# Patient Record
Sex: Male | Born: 1950 | Race: White | Hispanic: No | Marital: Married | Smoking: Former smoker
Health system: Southern US, Community
[De-identification: ages and names within clinical notes are randomized; demographics above are authoritative.]

## PROBLEM LIST (undated history)

## (undated) DIAGNOSIS — G629 Polyneuropathy, unspecified: Secondary | ICD-10-CM

## (undated) DIAGNOSIS — H9319 Tinnitus, unspecified ear: Secondary | ICD-10-CM

## (undated) DIAGNOSIS — R251 Tremor, unspecified: Secondary | ICD-10-CM

## (undated) DIAGNOSIS — K219 Gastro-esophageal reflux disease without esophagitis: Secondary | ICD-10-CM

## (undated) DIAGNOSIS — I10 Essential (primary) hypertension: Secondary | ICD-10-CM

## (undated) HISTORY — DX: Gastro-esophageal reflux disease without esophagitis: K21.9

## (undated) HISTORY — DX: Polyneuropathy, unspecified: G62.9

## (undated) HISTORY — DX: Tremor, unspecified: R25.1

## (undated) HISTORY — PX: ORIF TIBIA & FIBULA FRACTURES: SHX2131

## (undated) HISTORY — DX: Tinnitus, unspecified ear: H93.19

## (undated) HISTORY — DX: Essential (primary) hypertension: I10

---

## 1998-01-14 ENCOUNTER — Emergency Department (HOSPITAL_COMMUNITY): Admission: EM | Admit: 1998-01-14 | Discharge: 1998-01-14 | Payer: Self-pay | Admitting: Emergency Medicine

## 2004-03-29 ENCOUNTER — Inpatient Hospital Stay (HOSPITAL_COMMUNITY): Admission: EM | Admit: 2004-03-29 | Discharge: 2004-04-01 | Payer: Self-pay | Admitting: *Deleted

## 2004-03-29 HISTORY — DX: Rider (driver) (passenger) of other motorcycle injured in unspecified traffic accident, initial encounter: V29.99XA

## 2004-04-14 ENCOUNTER — Ambulatory Visit: Payer: Self-pay | Admitting: Physical Medicine & Rehabilitation

## 2004-04-14 ENCOUNTER — Inpatient Hospital Stay (HOSPITAL_COMMUNITY): Admission: RE | Admit: 2004-04-14 | Discharge: 2004-04-17 | Payer: Self-pay | Admitting: Orthopedic Surgery

## 2004-07-01 ENCOUNTER — Encounter: Admission: RE | Admit: 2004-07-01 | Discharge: 2004-07-01 | Payer: Self-pay | Admitting: Orthopedic Surgery

## 2004-08-12 ENCOUNTER — Encounter: Admission: RE | Admit: 2004-08-12 | Discharge: 2004-08-12 | Payer: Self-pay | Admitting: Orthopedic Surgery

## 2004-08-18 ENCOUNTER — Inpatient Hospital Stay (HOSPITAL_COMMUNITY): Admission: RE | Admit: 2004-08-18 | Discharge: 2004-08-20 | Payer: Self-pay | Admitting: Orthopedic Surgery

## 2004-08-18 ENCOUNTER — Ambulatory Visit: Payer: Self-pay | Admitting: Physical Medicine & Rehabilitation

## 2005-05-04 ENCOUNTER — Emergency Department (HOSPITAL_COMMUNITY): Admission: EM | Admit: 2005-05-04 | Discharge: 2005-05-04 | Payer: Self-pay | Admitting: Emergency Medicine

## 2006-03-14 IMAGING — CT CT RECONSTRUCTION
2 of 4 series · 8 of 14 positions shown, 10 images · non-contrast
Comparison: none

CLINICAL DATA: 53-year-old with left tibial plateau fracture.  
HELICAL CT EXAMINATION OF THE LEFT KNEE:
High resolution thin slice CT imaging was performed of the left knee with multiplanar reconstructed images performed.  3D reformatted images were also obtained.
There is a femoral screw in place for external fixation purposes.  There is a severely comminuted intraarticular fracture both medially and laterally.  The lateral tibial plateau is depressed approximately 9 mm. The tibial spines are in several pieces.  There is a large fracture fragment extending down below the medial tibial plateau and the medial tibial plateau is maintained in height.

[Series 2: lt knee · axial · 0.32mm/px · z∈[-354,-256]mm · 3 of 156 slices shown (1 of 2)]
[im 39/156  bone]
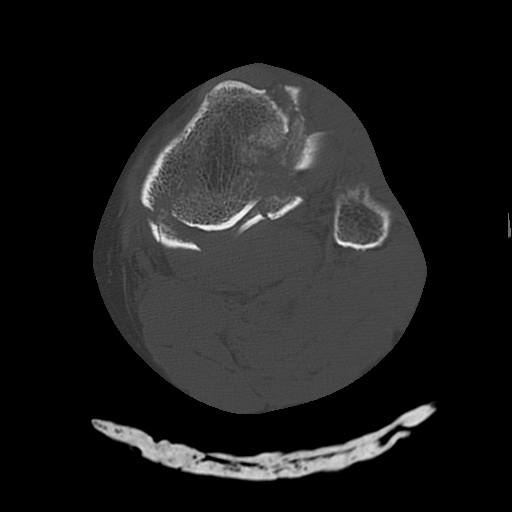
[im 78/156  bone]
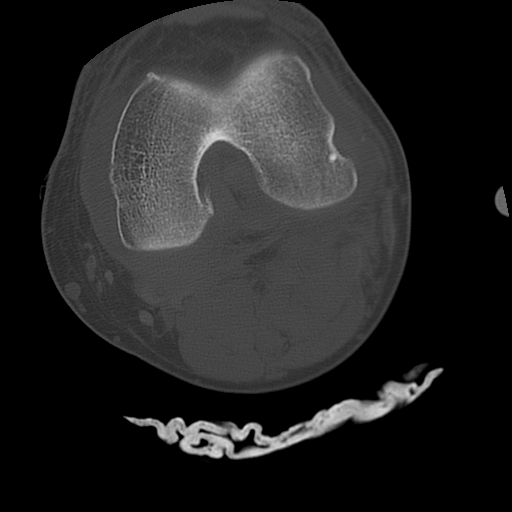
[im 117/156  bone]
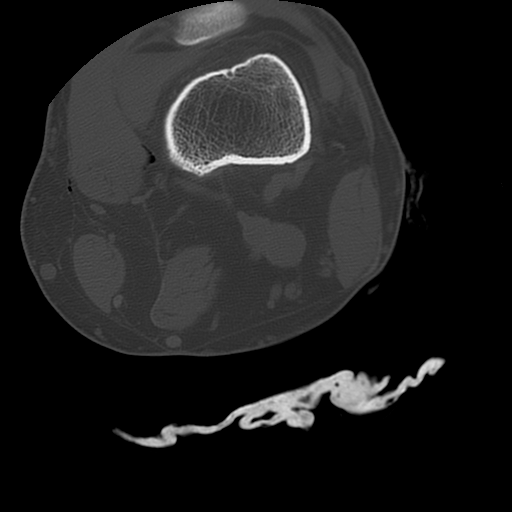

[Series 102: lt knee · axial · 0.32mm/px · z∈[-370,-240]mm · 5 of 313 slices shown, 7 images (2 of 2)]
[im 53/313  soft-tissue]
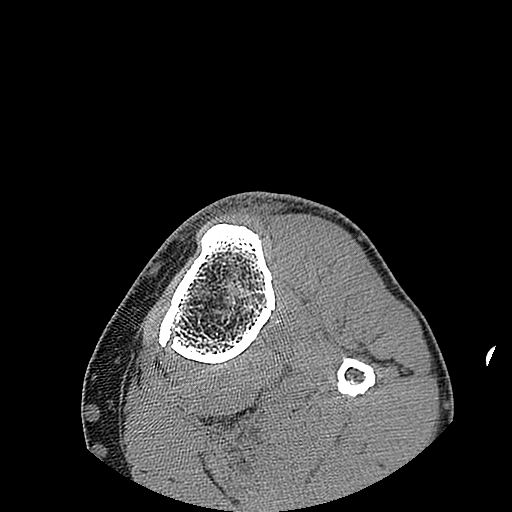
[im 53/313  bone]
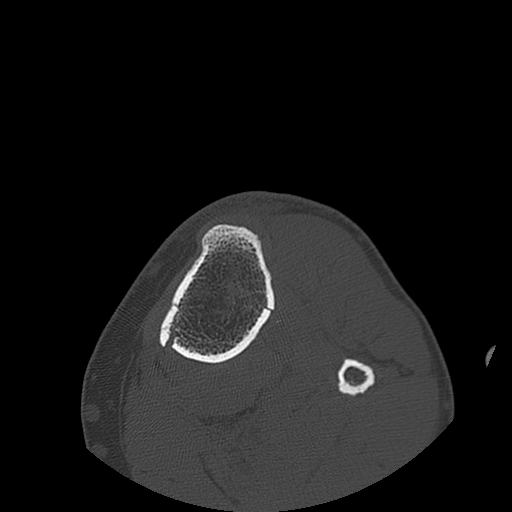
[im 105/313  bone]
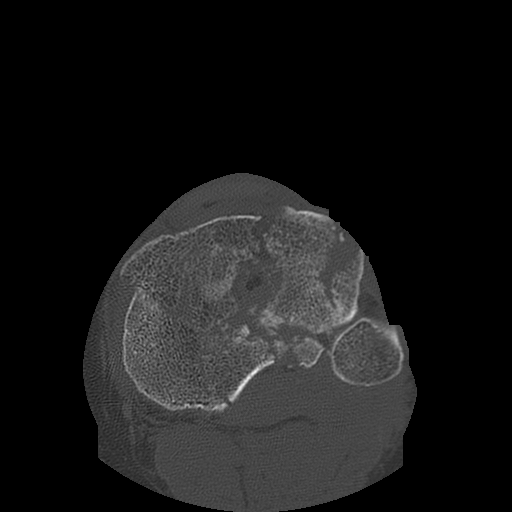
[im 157/313  bone]
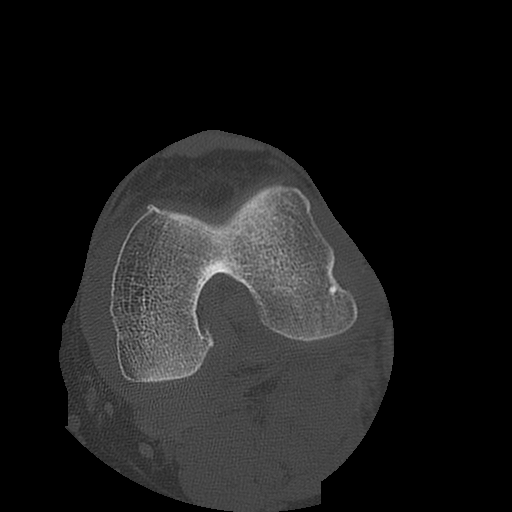
[im 209/313  bone]
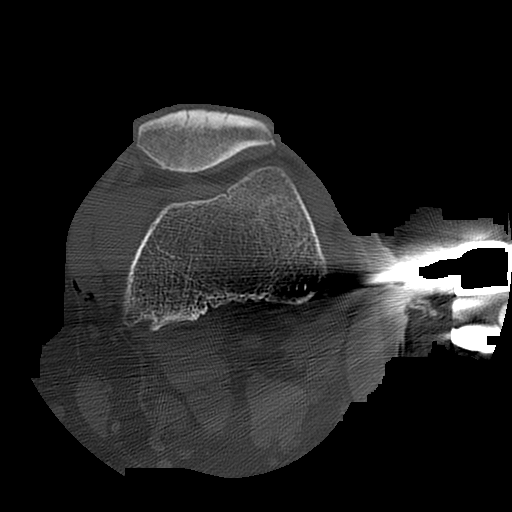
[im 261/313  soft-tissue]
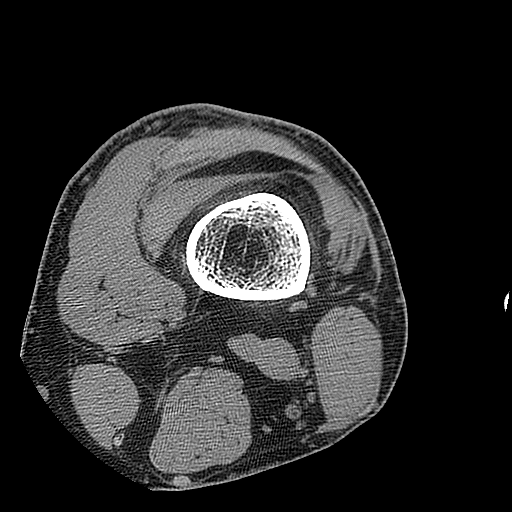
[im 261/313  bone]
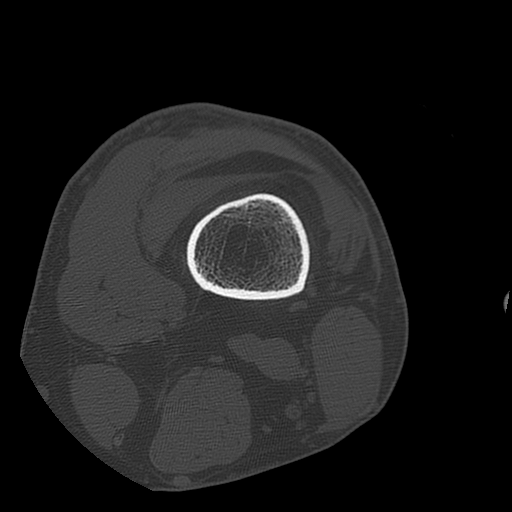

[8 of 14 positions shown; findings below may reference images not displayed]

IMPRESSION: Severely comminute intraarticular tibial plateau fractures with significant depression laterally. 
MULTIPLANAR REFORMATTED IMAGES: 
Coronal and sagittal reformatted images were obtained along with 3D images and again demonstrate the severely depressed and comminuted lateral tibial plateau fracture.  The medial tibial plateau has an oblique fracture and there is slight depression best seen in the sagittal plane with intraarticular component anteriorly and extending far medially.
IMPRESSION: Severely comminuted depressed tibial plateau fractures.
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
## 2006-03-30 IMAGING — RF DG TIBIA/FIBULA 2V*L*
1 series · 3 of 3 positions shown · non-contrast
Comparison: 03/29/04 CT.

CLINICAL DATA: Intraoperative spot fluoroscopic views during tibial plateau fracture, open reduction internal fixation.
LEFT TIBIA/FIBULA:

[Series 1: run · 3 of 3 slices shown]
[im 1/3]
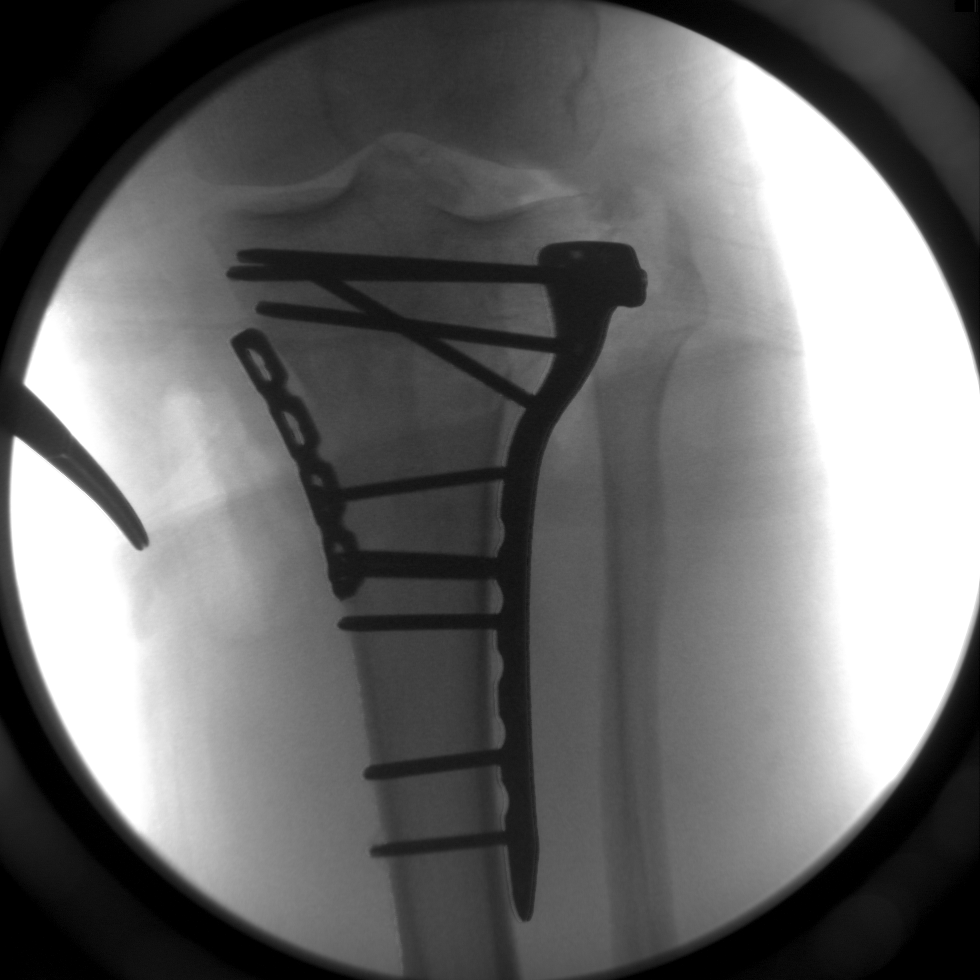
[im 2/3]
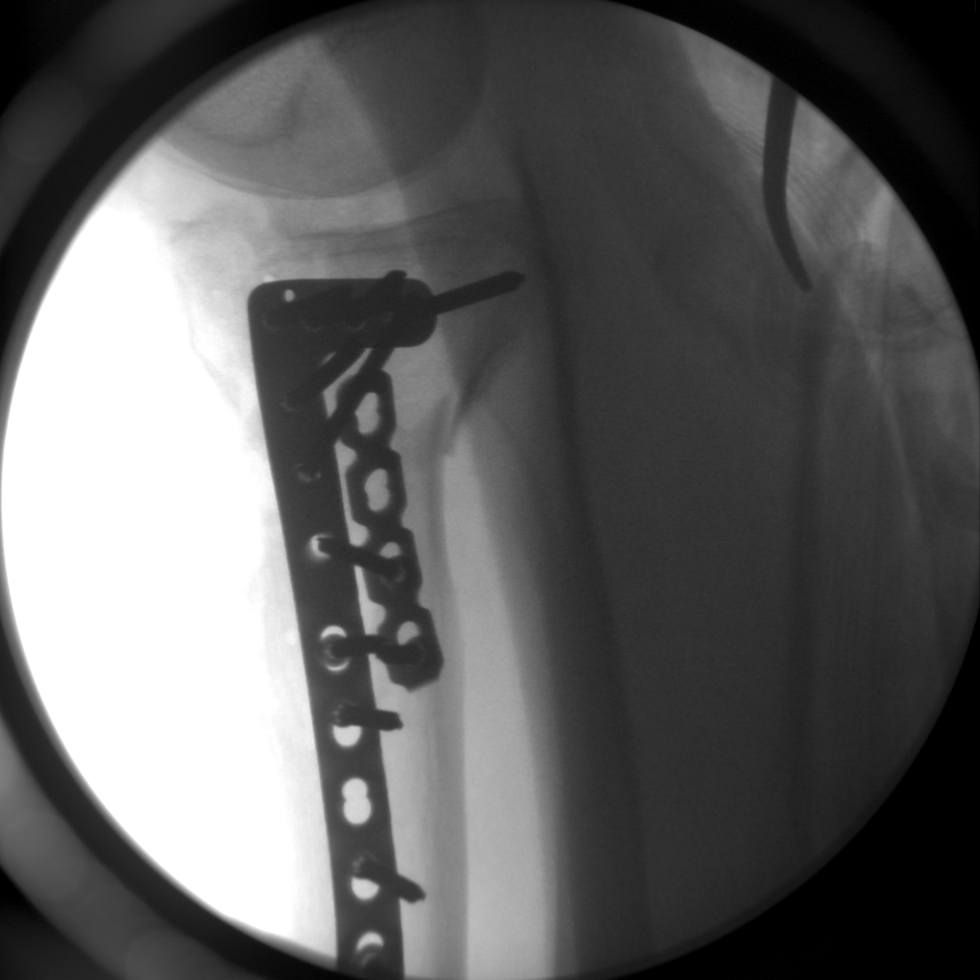
[im 3/3]
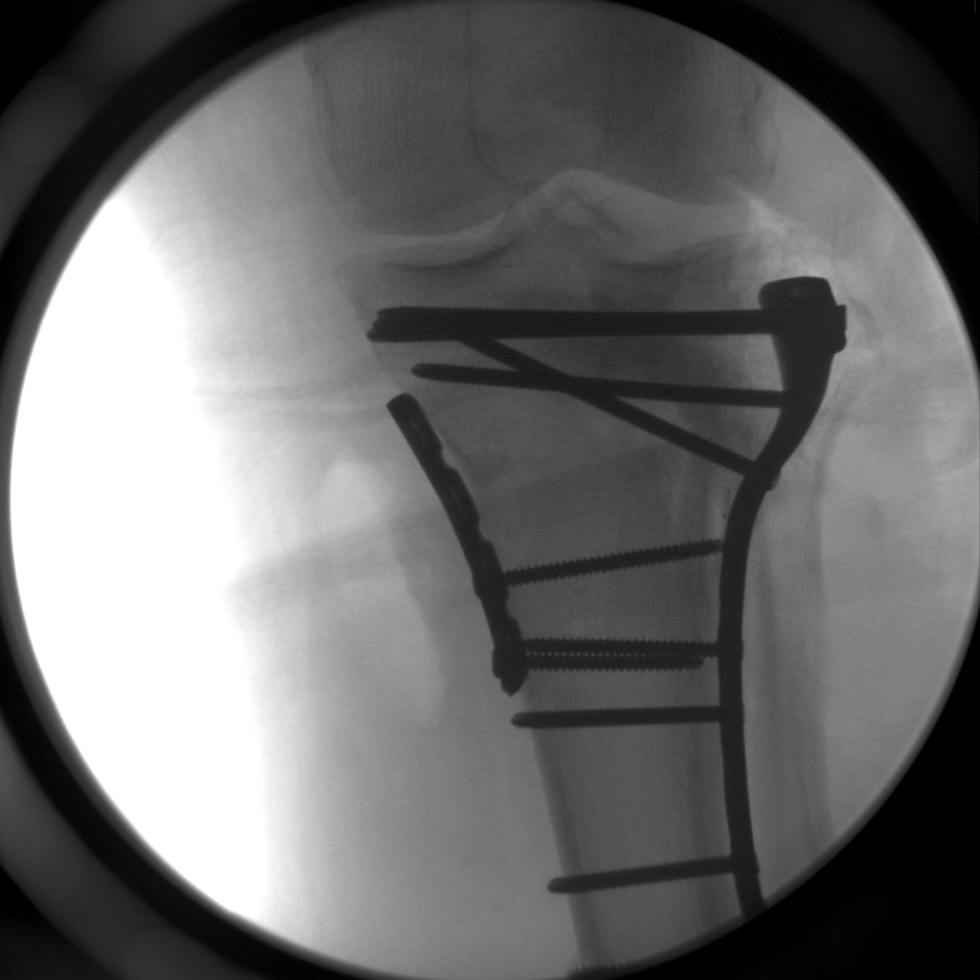

[3 of 3 positions shown; findings below may reference images not displayed]

FINDINGS: Open reduction internal fixation has been performed of the comminuted depressed lateral tibial plateau fracture. There is very minimal residual depression of the lateral tibial plateau. Plate and screw fixation is noted.
IMPRESSION: Improved alignment of a lateral tibial plateau intra-articular comminuted fracture with minimal residual depression.

## 2006-03-30 IMAGING — CR DG KNEE 1-2V PORT*L*
2 series · 2 of 2 positions shown · non-contrast
Comparison: none

CLINICAL DATA: Status post ORIF for plateau fracture.
LEFT KNEE TWO VIEWS - PORTABLE:
Two views using the portable apparatus show the patient status post ORIF for comminuted tibial plateau fracture.  Good position and alignment in two views.

[view not recorded (1 of 2)]
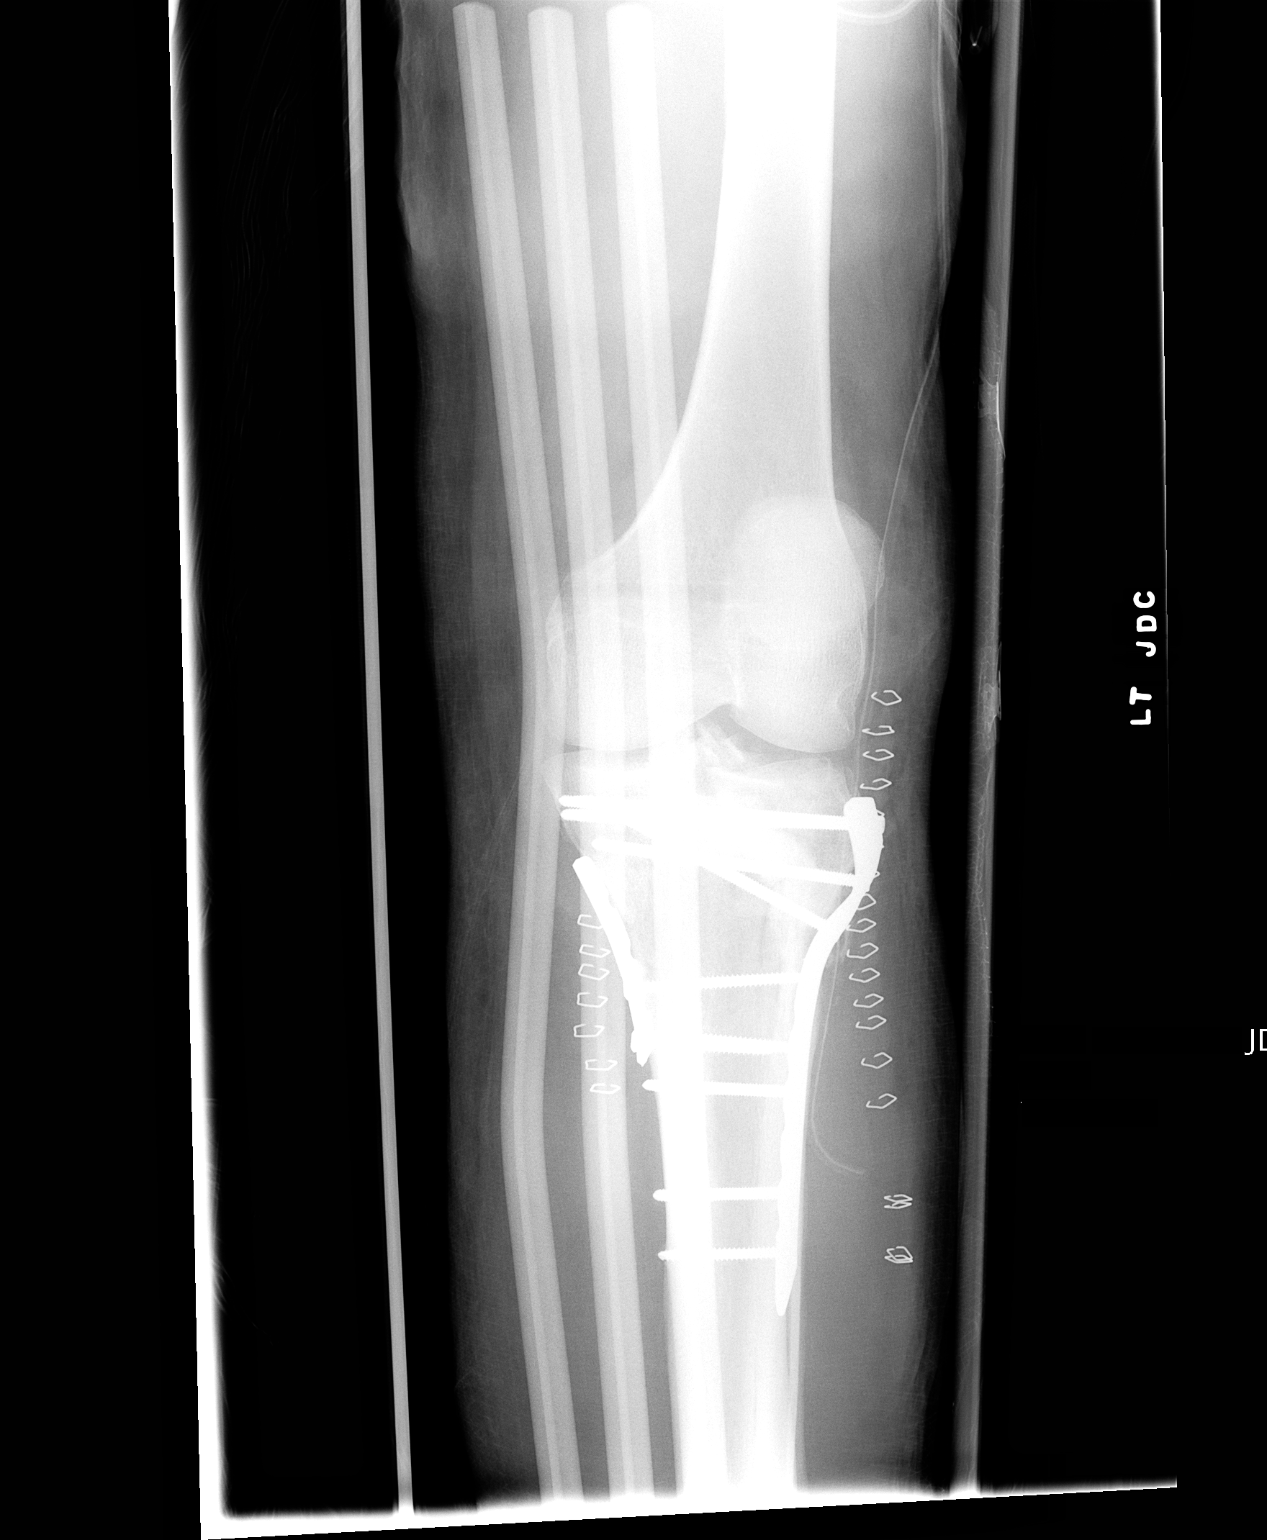

[view not recorded (2 of 2)]
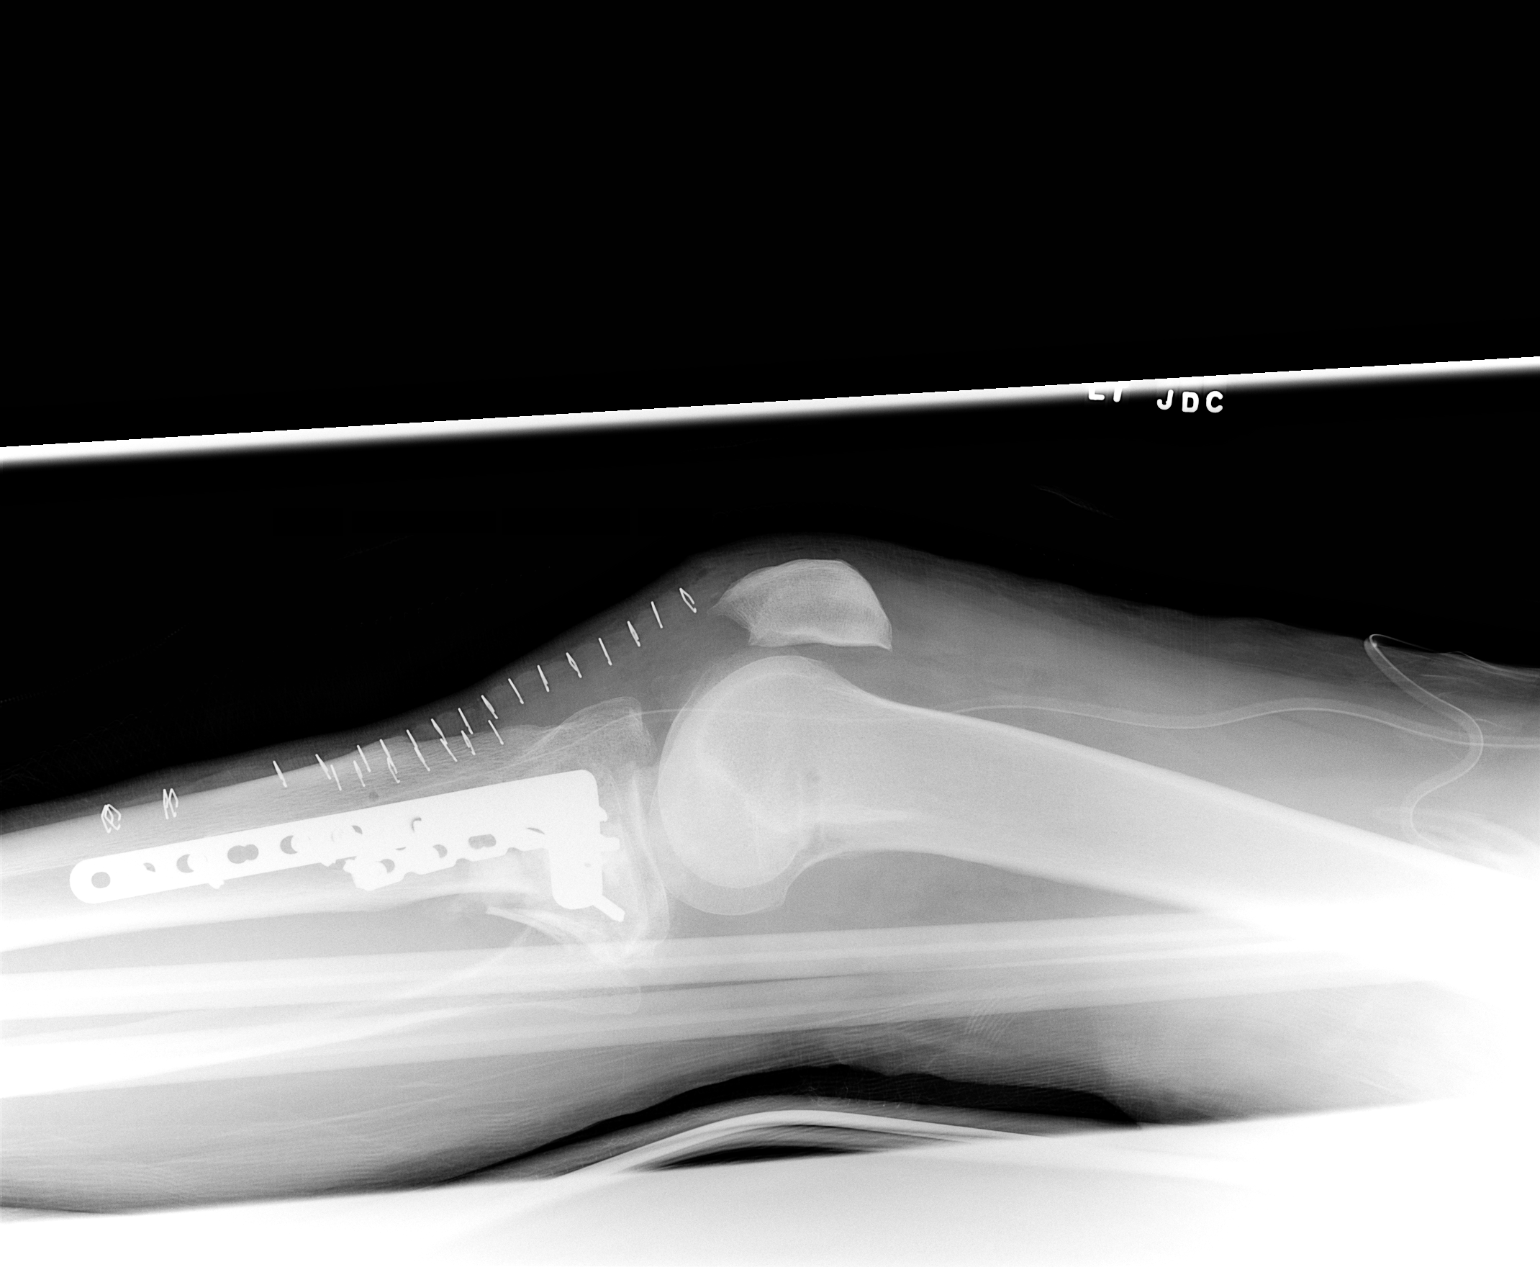

[2 of 2 positions shown; findings below may reference images not displayed]

IMPRESSION: Good position and alignment following ORIF of a comminuted tibial plateau fracture.

## 2007-05-18 HISTORY — PX: TOTAL KNEE ARTHROPLASTY: SHX125

## 2007-05-18 HISTORY — PX: REPLACEMENT TOTAL KNEE: SUR1224

## 2007-05-22 ENCOUNTER — Inpatient Hospital Stay (HOSPITAL_COMMUNITY): Admission: RE | Admit: 2007-05-22 | Discharge: 2007-05-27 | Payer: Self-pay | Admitting: Orthopedic Surgery

## 2010-06-07 ENCOUNTER — Encounter: Payer: Self-pay | Admitting: Orthopedic Surgery

## 2010-09-29 NOTE — H&P (Signed)
NAMEMAYO, FAULK                 ACCOUNT NO.:  [id number]   MEDICAL RECORD NO.:  [id number]          PATIENT TYPE:  INP   LOCATION:  NA                           FACILITY:  [facility]   PHYSICIAN:  Ollen Gross, M.D.    DATE OF BIRTH:  09/28/1950   DATE OF ADMISSION:  05/22/2007  DATE OF DISCHARGE:                              HISTORY & PHYSICAL   CHIEF COMPLAINT:  Bilateral knee pain.   HISTORY OF PRESENT ILLNESS:  The patient is a 60 year old male who has  seen by Dr. Lequita Halt as a new patient earlier this year for bilateral  knee pain.  He has a complex history in regards to his left knee.  Historically, his right knee has been more problematic over years,  receiving cortisone injections for arthritis.  He enforces sustained  comminuted tibial plateau on the left, treated by Dr. Lestine Box with  external fixer.  Dr. Kennon Rounds did a delayed internal fixation grafting.  He  was seen back for second opinion for iliac crest bone grafting.  It  eventually healed, but still developed post-traumatic arthritis.  He  continued to have progressive pain and issues with both knees. 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  He has reached a point where he would benefit undergoing  surgical intervention.  The patient would like to proceed with both  knees.  He has been seen preoperatively by Dr. Alwyn Ren who has been  managing it medically.  He has adjusted medications for improving his  blood pressure prior to procedure and elects to proceed with surgery.   ALLERGIES:  SULFA.   CURRENT MEDICATIONS:  Lisinopril, HCT, Celebrex and Prilosec.   PAST MEDICAL HISTORY:  1. Tinnitus.  2. Mild hypertension.  3. Reflux.  4. Arthritis.   PAST SURGICAL HISTORY:  Multiple surgery on the left knee including  external fixer and internal fixation with bone grafting, and then  eventual removal of hardware for total surgeries on the left.   FAMILY HISTORY:  Father  deceased at 54 with history of Parkinson's.  Mother living age 39.  One brother and one sister.   SOCIAL HISTORY:  Married, 12th grade education, was a Surveyor, minerals.   PAST HISTORY:  Smoking, occasional intake of alcohol with a meal.  One  adopted son.  Wife will be assisting with care after surgery.   REVIEW OF SYSTEMS:  GENERAL:  No fevers, chills, night sweats.  NEUROLOGICAL:  Does have tinnitus.  No seizures or paralysis.  RESPIRATORY:  No short breath, productive cough or hemoptysis.  CARDIOVASCULAR:  No chest pain, orthopnea.  GI: No nausea, vomiting,  diarrhea or constipation.  GU: No dysuria, hematuria or discharge.  MUSCULOSKELETAL:  With both knees.   PHYSICAL EXAMINATION:  VITAL SIGNS:  Pulse 64, respirations 12, blood  pressure 110/68.  GENERAL:  A 60 year old white male, well-nourished, well-developed, no  acute distress.  Alert, oriented, cooperative, pleasant.  HEENT:  Normocephalic, atraumatic.  Pupils round and reactive.  EOMs  intact.  No glasses.  NECK:  Supple.  No  carotid bruits are appreciated.  CHEST:  Clear anterior posterior chest walls.  No rales, wheezing.  HEART:  Regular rhythm.  No murmur.  ABDOMEN:  Soft, nontender.  Bowel sounds present.  RECTAL/BREASTS/GENITALIA:  Not done, not pertinent to present illness.  EXTREMITIES:  Right knee shows range of motion 5-125.  No instability.  Marked crepitus left knee range of motion 5-150.  No instability,  moderate crepitus.   IMPRESSION:  1. 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  2. Tinnitus.  3. Mild hypertension.  4. Reflux disease.   PLAN:  The patient admitted to [facility] to undergo a  bilateral total knee arthroplasty procedure.  Surgery will be performed  by Dr. Ollen Gross.      Alexzandrew L. Perkins, P.A.C.      Ollen Gross, M.D.  Electronically Signed    ALP/MEDQ  D:  05/16/2007  T:  05/16/2007  Job:  [id number]   cc:   Peyton Najjar, MD    Ollen Gross, M.D.  Fax: [phone]

## 2010-09-29 NOTE — Op Note (Signed)
[name], [name]                 ACCOUNT NO.:  [id number]   MEDICAL RECORD NO.:  [id number]          PATIENT TYPE:  INP   LOCATION:  0004                         FACILITY:  [facility]   PHYSICIAN:  Ollen Gross, M.D.    DATE OF BIRTH:  02/22/1951   DATE OF PROCEDURE:  05/22/2007  DATE OF DISCHARGE:                               OPERATIVE REPORT   PREOPERATIVE DIAGNOSIS:  Osteoarthritis, bilateral knees.   POSTOPERATIVE DIAGNOSIS:  Osteoarthritis, bilateral knees.   PROCEDURE:  Bilateral total knee arthroplasty.   SURGEON:  Ollen Gross, M.D.   ASSISTANT:  Avel Peace, PA-C   ANESTHESIA:  General with postop epidural.   ESTIMATED BLOOD LOSS:  Minimal.   DRAINS:  Autovac each side.   TOURNIQUET TIME:  69 minutes at 300 mmHg on the left, 41 minutes at 300  mmHg on the right.   COMPLICATIONS:  None.   CONDITION:  Stable to recovery room.   CLINICAL NOTE:  [name] is a 60 year old male with a severe end-stage  arthritis of the left worse than right knee.  The left side is  posttraumatic after a tibial plateau fracture.  He went on to develop  severe posttraumatic arthritis.  Right side was also bone on bone.  He  has failed nonoperative management including injections and presents now  for bilateral total knee arthroplasty.   PROCEDURE IN DETAIL:  After successful administration of epidural  anesthetic and subsequent general anesthetic, tourniquets were placed  high on both thighs and both lower extremities are prepped and draped in  the usual sterile fashion.  We went with the left side first since that was the more chronic one and  more symptomatic.  The left lower extremity was wrapped in Esmarch, the  knee flexed, and tourniquet inflated to 300 mmHg.  Midline incision was  made with a 10-blade through subcutaneous tissue to the level of the  extensor mechanism.  A fresh blade was used to make a medial  parapatellar arthrotomy.  Soft tissue on the proximal medial  tibia was  subperiosteally elevated to the joint line with the knife and into the  semimembranosus bursa with a Cobb elevator.  Soft tissue laterally is  elevated, with attention being paid to avoid the patellar tendon or  tibial tubercle.  Patella subluxed laterally, knee flexed 90 degrees,  ACL and PCL removed.  Drill was used to create a starting hole in the  distal femur and the canal was thoroughly irrigated.  The 5 degrees left  valgus alignment guide was placed referencing off the posterior  condyles, rotations marked, and a block pinned to remove 10 mm of the  distal femur.  Distal femoral resection was made with an oscillating  saw.  Sizing block is placed and size 4 is most appropriate.  Rotations  marked at the epicondylar axis.  Size 4 cutting blocks placed and  anterior, posterior and chamfer cuts made.   Tibia subluxed forward and menisci removed.  The extramedullary tibial  alignment guide is placed referencing proximally at the medial aspect of  the tibial tubercle and distally  along the second metatarsal axis of the  tibial crest.  We pinned the block to remove 2 mm off the more deficient  lateral side.  Tibial resection is made with an oscillating saw.  Given  the lateral tibial plateau fracture, I wanted to place a cemented stem  to bypass the previously injured bone and cement down into more normal  bone.  We had made our tibial resection, felt that 4 was the most  appropriate tray.  We pinned the tray in the appropriate position and  then reamed proximally with the proximal reamer and then distally to 13  mm with straight reamer.  It was noted that with attempts at reaming it  was kicking the tray into valgus and upon inspecting the canal, it was  noted there was a lot of  sclerotic bone laterally.  I used a high-speed  bur to sequentially debride this down to the cancellus bone that would  not be blocking the canal.  We then reamed again proximally and indeed  it  was more central reaming that did not kick the tray out of position.  The size 4 M.B.T. revision tray that was a 13 x 30 stem extension trial  was placed and then I used the keel punch proximally to finish proximal  preparation of the tibia.  On the femoral side we then made the  intercondylar cut with the size 4.  The size 4 posterior stabilized  femoral trial was then placed.  We then placed a 12.5 mm posterior  stabilized rotating platform insert trial but needed to go to a 15.  With the 15 full extension achieved, we had excellent varus/valgus,  anterior and posterior balance throughout.  Patella was everted,  thickness measured to be 24 mm.  Freehand resection was taken to 14 mm,  38 template was placed, lug holes were drilled, trial patella is placed  and tracks normally.  Osteophytes removed off the posterior femur with  the trial in place.  All trials were removed and the cut bone surfaces  are prepared with pulsatile lavage.  Cement restricter is placed at the  appropriate depth in the tibial canal.  Cement was then mixed and once  ready for implantation is injected in the tibial canal and then on the  cut surface of the tibia and pressurized.  The size 4 M.B.T. revision  tray with a 13 x 37 cm extension is then impacted and cemented into the  tibial canal.  All extruded cement was removed.  On the femoral side we  placed the size 4 posterior stabilized femoral component and cemented  that in placed.  The 38 patella was also cemented and held with a clamp.  Trial 15 mm insert was placed and the knee held in full extension and  all extruded cement removed.  When the cement was fully hardened, then  we thoroughly irrigated the joint and placed FloSeal in the posterior  capsule.  The 15 mm posterior stabilized rotating platform insert is  then placed into the tibial tray.  FloSeal was then placed on the medial  and lateral gutters and suprapatellar area.  The tourniquet was then   released for a total time of 69 minutes.  Moist sponge is placed and  minimal bleeding was encountered.  Sponges removed and any bleeding  encountered stopped with cautery.  Hemovac drain is then placed and  hooked to the Autovac suction.  The extensor mechanism was then closed  with interrupted #1  PDS with flexion against gravity of 140 degrees.  Subcu was closed with interrupted 2-0 Vicryl, subcuticular running 4-0  Monocryl.  Incisions cleaned and dried and Steri-Strips applied.   The right side was then addressed.  Right lower extremity was wrapped in  Esmarch, knee flexed, tourniquet inflated to 300 mmHg.  Midline incision  with a 10 blade through the subcutaneous tissue to the extensor  mechanism.  Fresh blade is used to make a medial parapatellar  arthrotomy.  Soft tissue proximal medial tibia subperiosteally elevated  to the joint line with the knife and into the semimembranosus bursa with  a Cobb elevator.  Soft tissue laterally elevated with attention being  paid toward the patellar tendon on the tibial tubercle.  Patella  subluxed laterally, knee flexed 90 degrees, ACL and PCL removed.  A  drill was used create a starting hole in the distal femur and the canal  was thoroughly irrigated.  The 5 degrees right valgus alignment guide  was placed, the posterior condyles rotations marked, and the block  pinned to remove 11 mm of the distal femur.  I took 11 mm because of a  preop flexion contracture.  Sizing blocks placed, size 4 was the most  appropriate.  Rotations marked at the epicondylar axis.  Size 4 cutting  blocks placed and the anterior and posterior and chamfer cuts are made.   Tibia subluxed forward, the menisci removed.  Extramedullary tibial  alignment guide is placed referencing proximally at the medial aspect of  the tibial tubercle and distally along the second metatarsal axis of the  tibial crest.  The block is pinned to remove 10 mm of the nondeficient  lateral  side.  Tibial resection is made with an oscillating saw.  Sizing  blocks placed, size 4 is most appropriate.  Proximal tibia was prepared  with the modular drill and keel punch for a size 4.  Femoral preparation  is completed with the intercondylar cut.   Size 4 mobile bearing tibial trial, size 4 posterior stabilized femoral  trial and a 12.5 mL posterior stabilized rotating platform insert trial  are placed, with the 12/5 full extensions achieved with excellent varus  and valgus balance throughout full range of motion.  Patella was everted  and thickness measured to be 24 mm.  Freehand resection taken to 14 mm,  38 template placed, lug holes were drilled, trial patella is placed and  it tracks normally.  Osteophytes were then removed off the posterior  femur with the trial in place.  All trials were removed and the cut bone  surfaces prepared with pulsatile lavage.  Cement was mixed and once  ready for implantation, size 4 mobile bearing tibial tray, size 4  posterior stabilized femur and 38 patella are cemented into place.  The  patella was held with the clamp.  Trial 12.5 mm insert was placed, the  knee held in full extension, and all extruded cement removed.  When the  cement was fully hardened then the permanent 12.5 mm posterior  stabilized rotating platform insert is placed and tibial tray.  The  wound was copiously irrigated with saline solution and the extensor  mechanism closed over an Autovac drain with interrupted #1 PDS.  Flexion  against gravity is 140 degrees.  Tourniquet was then released with a  total time of 41 minutes.  Subcu was closed with interrupted 2-0 Vicryl,  subcuticular running 4-0 Monocryl.  Incision was clean and dry and Steri-  Strips and then  bulky sterile dressing was applied on both knees.  He  was then placed into immobilizers, awakened and transported to recovery  in stable condition.      Ollen Gross, M.D.  Electronically Signed     FA/MEDQ   D:  05/22/2007  T:  05/22/2007  Job:  [id number]

## 2010-10-02 NOTE — Discharge Summary (Signed)
[name], [name]                 ACCOUNT NO.:  [id number]   MEDICAL RECORD NO.:  [id number]          PATIENT TYPE:  INP   LOCATION:  1610                         FACILITY:  [facility]   PHYSICIAN:  [name], M.D.    DATE OF BIRTH:  09/17/1950   DATE OF ADMISSION:  05/22/2007  DATE OF DISCHARGE:  05/27/2007                               DISCHARGE SUMMARY   ADMISSION DIAGNOSES:  1. Osteoarthritis bilateral knees  2. Tinnitus.  3. Mild hypertension.  4. Reflux disease.   DISCHARGE DIAGNOSIS:  1. 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  2. Postop blood loss anemia.  3. Mild hyponatremia.  Marland Kitchen  4. Tinnitus.  5. Mild hypertension.  6. Reflux disease.   PROCEDURE:  May 22, 2007, bilateral total knee arthroplasty.  Surgeon:  Dr. Lequita Kelly.  Assistant:  Corey Peace, PA-C.  Anesthesia:  General.  Postop epidural.   CONSULTATIONS:  None.   BRIEF HISTORY:  Mr. [name] is a  60 year old male with severe end-stage  arthritis of both knees, left worse than right. 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  Failed nonoperative management  including injections, now presents for total knee arthroplasties.   LABORATORY DATA:  Preop CBC showed a hemoglobin of 17.1, hematocrit  48.3, white cell count 10.2. Postop hemoglobin 10.7. Lower hemoglobin  and hematocrit drifted down to 8.8 with 24.6, respectively; came back up  a little bit. Last hemoglobin and hematocrit prior to discharge were 8.9  and 25.1, respectively.  PT and PTT preop 12.7 and 27, respectively.  INR 0.9.  Serial pro times followed.  Last PT 21.2, INR 1.8.  Chemistry  panel on admission:  Elevated BUN at 25, glucose 118.  Remaining  chemistry panel all within normal limits.  Serial BMET followed.  Electrolytes:  Sodium showed a drop from 140-135, last noted at 134.  Remaining  electrolytes remained within normal limits.  BUN came down to  normal level of 99.  Preop UA negative.  Followup UA on May 25, 2007,  small hemoglobin, 0-2 white cells, 0-2 red cells, otherwise negative.  Repeat UA on May 26, 2007, small hemoglobin, 0-2 white cells, 0-2 red  cells.  Blood group type AB positive. Urine culture:  Insignificant  growth.   Two-view chest May 26, 2007:  Suggestion of bronchitic changes.   EKG April 25, 2007:  Sinus rhythm, marked left axis deviation, no  acute changes.   Chest x-ray preop April 24, 2008:  No active disease.   HOSPITAL COURSE:  The patient admitted to [facility],  tolerated seizure well, later transferred to the recovery room and then  orthopedic floor.  He did have an epidural placed by anesthesia  postoperatively for postop pain control which was monitored by them.  He  used PCA for breakthrough. We went ahead and put him on iron  supplements.  Hemoglobin was 10.7 postop with bilateral knees.  He had  some mild hypotension, was asymptomatic with this, felt to be  a  combination of the drop in hemoglobin and also the epidural. We held his  blood pressure medications, continued his other medications.  He was  started on Coumadin on the evening of postop day #1 and not given  Lovenox until postop day #2 after the epidural had been removed.  He  just was out of bed to chair on day #1.   By day #2, he was doing very well.  Epidural came out later that day by  anesthesia. Changed both dressings. Both incisions looked very good.  Hemoglobin was 9.2. We would consider transfusion.  He got symptomatic.  His pressure was still a little low, but he was asymptomatic with it.  Started Lovenox later that afternoon after the epidural had been out.  Once the epidural was out, that afternoon he did extremely well, walked  about 145 feet, which is fantastic following bilateral total knees.  He  continued to progress well. By day #3,  he was still requiring PCA.  We  decreased that a little bit. Was on Coumadin and Lovenox for DVT  prophylaxis.  Hemoglobin was 8.90.  Continued on the iron.  He had a  little bit of low sodium of 134, felt to be delusional. Continued to  progress well with his therapy and remained in hospital until May 27, 2007, when he was doing well, tolerating his medications, and  discharged home.   DISCHARGE PLAN:  1. The patient was discharged home on May 27, 2007.  2. Discharge diagnoses, please see above.  3. Discharge medications:  Magic mouth, Nu-Iron, Percocet, Robaxin,      Coumadin, started on Z-Pak by the covering physician.  4. Diet:  Heart-healthy diet.  5. Activity:  Weightbearing as tolerated both legs. Do not submerge      incisions under water.  Home health PT, home health nursing, total      knee protocol.  6. Followup in 2 weeks.   DISPOSITION:  Home.   CONDITION ON DISCHARGE:  Improved.      [name], P.A.C.      [name], M.D.  Electronically Signed    ALP/MEDQ  D:  07/04/2007  T:  07/04/2007  Job:  [id number]   cc:   Corey Najjar, MD   [facility]  [address]  [place], [place] [address]  FAX [phone]

## 2011-02-04 LAB — CBC
HCT: 24.6 — ABNORMAL LOW
Hemoglobin: 9.2 — ABNORMAL LOW
MCHC: 35
MCHC: 35.7
Platelets: 149 — ABNORMAL LOW
Platelets: 153
RBC: 2.7 — ABNORMAL LOW
RBC: 2.78 — ABNORMAL LOW
RBC: 2.88 — ABNORMAL LOW
RDW: 13.5
RDW: 13.5
WBC: 10.2
WBC: 10.2
WBC: 9.9

## 2011-02-04 LAB — BASIC METABOLIC PANEL
BUN: 17
BUN: 9
Calcium: 8 — ABNORMAL LOW
Calcium: 8 — ABNORMAL LOW
Creatinine, Ser: 1.46
GFR calc Af Amer: >60
GFR calc Af Amer: >60
GFR calc non Af Amer: 50 — ABNORMAL LOW
GFR calc non Af Amer: 50 — ABNORMAL LOW
GFR calc non Af Amer: 59 — ABNORMAL LOW
Glucose, Bld: 129 — ABNORMAL HIGH
Potassium: 4.5
Potassium: 4.7
Sodium: 134 — ABNORMAL LOW
Sodium: 135
Sodium: 137

## 2011-02-04 LAB — URINALYSIS, ROUTINE W REFLEX MICROSCOPIC
Bilirubin Urine: NEGATIVE
Glucose, UA: NEGATIVE
Ketones, ur: NEGATIVE
Leukocytes, UA: NEGATIVE
Nitrite: NEGATIVE
Protein, ur: NEGATIVE
Specific Gravity, Urine: 1.019
Urobilinogen, UA: 0.2
Urobilinogen, UA: 0.2

## 2011-02-04 LAB — PROTIME-INR
INR: 1.2
INR: 1.2
INR: 1.8 — ABNORMAL HIGH
Prothrombin Time: 15.9 — ABNORMAL HIGH
Prothrombin Time: 21.2 — ABNORMAL HIGH
Prothrombin Time: 21.4 — ABNORMAL HIGH

## 2011-02-04 LAB — TYPE AND SCREEN
ABO/RH(D): A POS
Antibody Screen: NEGATIVE

## 2011-02-04 LAB — URINE MICROSCOPIC-ADD ON

## 2011-02-04 LAB — URINE CULTURE
Colony Count: 6000
Special Requests: NEGATIVE

## 2011-02-04 LAB — ABO/RH: ABO/RH(D): A POS

## 2011-02-19 LAB — CBC
Hemoglobin: 17.1 — ABNORMAL HIGH
MCHC: 35.4
RDW: 13.5

## 2011-02-19 LAB — COMPREHENSIVE METABOLIC PANEL
ALT: 45
Calcium: 9.9
GFR calc Af Amer: >60
Glucose, Bld: 118 — ABNORMAL HIGH
Sodium: 142
Total Protein: 7.6

## 2011-02-19 LAB — URINALYSIS, ROUTINE W REFLEX MICROSCOPIC
Glucose, UA: NEGATIVE
Nitrite: NEGATIVE
Protein, ur: NEGATIVE
Urobilinogen, UA: 0.2

## 2011-02-19 LAB — PROTIME-INR: Prothrombin Time: 12.7

## 2011-02-19 LAB — APTT: aPTT: 27

## 2013-04-21 ENCOUNTER — Ambulatory Visit (INDEPENDENT_AMBULATORY_CARE_PROVIDER_SITE_OTHER): Payer: BLUE CROSS/BLUE SHIELD | Admitting: Family Medicine

## 2013-04-21 VITALS — BP 138/92 | HR 66 | Temp 98.5°F | Resp 18 | Ht 70.5 in | Wt 206.2 lb

## 2013-04-21 DIAGNOSIS — L723 Sebaceous cyst: Secondary | ICD-10-CM

## 2013-04-21 DIAGNOSIS — L02219 Cutaneous abscess of trunk, unspecified: Secondary | ICD-10-CM

## 2013-04-21 DIAGNOSIS — L02212 Cutaneous abscess of back [any part, except buttock]: Secondary | ICD-10-CM

## 2013-04-21 MED ORDER — DOXYCYCLINE HYCLATE 100 MG PO TABS: 100.0000 mg | ORAL_TABLET | Freq: Two times a day (BID) | ORAL | Status: DC

## 2013-04-21 NOTE — Progress Notes (Signed)
Subjective: Patient has had a sebaceous cyst on his back for a long time. Recently has started getting big and swollen and painful. For years that he just squeezed the contents out of it and it would go back down. More recently they have not been able to get anything out.  He is allergic to sulfa. No other major medical concerns.  Objective: Very tender sebaceous cyst that is starting to come to a head on his mid back, approximately 5 or 6 cm in diameter for area of induration. Mild erythema.  Assessment: Sebaceous cyst on back, abscess  Plan: I&D Doxycycline

## 2013-04-21 NOTE — Progress Notes (Signed)
Verbal Consent Obtained. Local anesthesia with 1 cc of 2% lidocaine plain.  11 blade used to incise the lesion centrally.  Large amount of sebaceous material and small purulence expressed. Cyst sac material removed with pickups. Packed with 1/4 inch plain packing.  Cleansed and dressed.

## 2013-04-21 NOTE — Patient Instructions (Signed)
Continue the antibiotic as prescribed. Apply a warm compress to the area for 15-20 minutes 2-4 times each day. Change the dressing if it becomes saturated, leaks or falls off.  

## 2013-04-23 ENCOUNTER — Ambulatory Visit (INDEPENDENT_AMBULATORY_CARE_PROVIDER_SITE_OTHER): Payer: BLUE CROSS/BLUE SHIELD | Admitting: Physician Assistant

## 2013-04-23 VITALS — BP 162/90 | HR 74 | Temp 98.1°F | Resp 16 | Ht 69.0 in | Wt 209.0 lb

## 2013-04-23 DIAGNOSIS — L02219 Cutaneous abscess of trunk, unspecified: Secondary | ICD-10-CM

## 2013-04-23 DIAGNOSIS — L02212 Cutaneous abscess of back [any part, except buttock]: Secondary | ICD-10-CM

## 2013-04-23 NOTE — Progress Notes (Signed)
   Subjective:    Patient ID: [name], male    DOB: 02-08-1951, 62 y.o.   MRN: [id number]  Chief Complaint  Patient presents with  . Wound Check    HPI  Presents for wound care s/p I&D of an abscess on the back on 04/21/2013. He's doing well, with no pain.  He describes pulling of the dressing on the surrounding skin.  Tolerating the antibiotic without difficulty.  Wound culture is pending.  Review of Systems     Objective:   Physical Exam BP 162/90  Pulse 74  Temp(Src) 98.1 F (36.7 C)  Resp 16  Ht 5\' 9"  (1.753 m)  Wt 209 lb (94.802 kg)  BMI 30.85 kg/m2  SpO2 99%  Dressing and packing removed.  Minimal erythema.  Small area of induration.  Wound bed is beefy red and bloody. Non-tender.  Gently repacked with a small amount of 1/4 inch packing and re-dressed.       Assessment & Plan:  1. Abscess of back Continue current medications and local wound care. RTC 48 hours.   Fernande Bras, PA-C Physician Assistant-Certified [facility]

## 2013-04-23 NOTE — Patient Instructions (Signed)
Continue the antibiotic as prescribed. Apply a warm compress to the area for 15-20 minutes 2-4 times each day. Change the dressing if it becomes saturated, leaks or falls off.  

## 2013-04-24 LAB — WOUND CULTURE

## 2013-04-25 ENCOUNTER — Ambulatory Visit (INDEPENDENT_AMBULATORY_CARE_PROVIDER_SITE_OTHER): Payer: BLUE CROSS/BLUE SHIELD | Admitting: Physician Assistant

## 2013-04-25 VITALS — BP 130/88 | HR 66 | Temp 97.8°F | Resp 16 | Ht 70.0 in | Wt 206.0 lb

## 2013-04-25 DIAGNOSIS — L02219 Cutaneous abscess of trunk, unspecified: Secondary | ICD-10-CM

## 2013-04-25 DIAGNOSIS — L02212 Cutaneous abscess of back [any part, except buttock]: Secondary | ICD-10-CM

## 2013-04-25 NOTE — Progress Notes (Signed)
   Subjective:    Patient ID: [name], male    DOB: 1951/04/16, 62 y.o.   MRN: [id number]  Chief Complaint  Patient presents with  . Wound Care    Abscess of back    HPI  Presents for wound care s/p I&D of an abscess on the back on 04/21/2013. He's doing well, with no pain. Tolerating the antibiotic without difficulty.  Wound culture revealed NO GROWTH.  Review of Systems     Objective:   Physical Exam  BP 130/88  Pulse 66  Temp(Src) 97.8 F (36.6 C) (Oral)  Resp 16  Ht 5\' 10"  (1.778 m)  Wt 206 lb (93.441 kg)  BMI 29.56 kg/m2  SpO2 99%  Dressing and packing removed. Minimal erythema. Small area of induration. Wound bed is beefy red. Non-tender. Gently repacked with a small amount of 1/4 inch packing and re-dressed.      Assessment & Plan:  1. Abscess of back Due to infected sebaceous cyst.  Complete antibiotic.  Continue local wound care.  RTC 48 hours, possibly for last visit.   Fernande Bras, PA-C Physician Assistant-Certified [facility]

## 2013-04-27 ENCOUNTER — Ambulatory Visit (INDEPENDENT_AMBULATORY_CARE_PROVIDER_SITE_OTHER): Payer: BLUE CROSS/BLUE SHIELD | Admitting: Physician Assistant

## 2013-04-27 VITALS — BP 158/102 | HR 65 | Temp 98.0°F | Resp 18 | Wt 206.0 lb

## 2013-04-27 DIAGNOSIS — L02212 Cutaneous abscess of back [any part, except buttock]: Secondary | ICD-10-CM

## 2013-04-27 DIAGNOSIS — L02219 Cutaneous abscess of trunk, unspecified: Secondary | ICD-10-CM

## 2013-04-27 NOTE — Progress Notes (Signed)
   Patient ID: LOTUS GOVER MRN: [id number], DOB: [birth year] 62 y.o. Date of Encounter: 04/27/2013, 5:36 PM  Primary Physician: No primary provider on file.  Chief Complaint: Wound care   See previous note  HPI: 62 y.o. male presents for wound care s/p I&D on 04/21/13 Doing well No issues or complaints Afebrile/ no chills No nausea or vomiting Tolerating doxycycline Pain resolved Previous note reviewed  No past medical history on file.   Home Meds: Prior to Admission medications   Medication Sig Start Date End Date Taking? Authorizing Provider  doxycycline (VIBRA-TABS) 100 MG tablet Take 1 tablet (100 mg total) by mouth 2 (two) times daily. 04/21/13  Yes Peyton Najjar, MD  ibuprofen (ADVIL,MOTRIN) 200 MG tablet Take 200 mg by mouth every 6 (six) hours as needed.   Yes Historical Provider, MD  omeprazole (PRILOSEC) 20 MG capsule Take 20 mg by mouth daily.   Yes Historical Provider, MD  Vitamins-Lipotropics (LIPOFLAVONOID PO) Take by mouth.   Yes Historical Provider, MD    Allergies:  Allergies  Allergen Reactions  . Sulfa Antibiotics Hives    ROS: Constitutional: Afebrile, no chills Cardiovascular: negative for chest pain or palpitations Dermatological: Positive for wound. Negative for erythema, pain, or warmth  GI: No nausea or vomiting   EXAM: Physical Exam: Blood pressure 158/102, pulse 65, temperature 98 F (36.7 C), temperature source Oral, resp. rate 18, weight 206 lb (93.441 kg), SpO2 97.00%., Body mass index is 29.56 kg/(m^2). General: Well developed, well nourished, in no acute distress. Nontoxic appearing. Head: Normocephalic, atraumatic, sclera non-icteric.  Neck: Supple. Lungs: Breathing is unlabored. Heart: Normal rate. Skin:  Warm and moist. Dressing and packing in place. No induration, erythema, or tenderness to palpation. Neuro: Alert and oriented X 3. Moves all extremities spontaneously. Normal gait.  Psych:  Responds to questions appropriately  with a normal affect.   PROCEDURE: Dressing and packing removed. No purulence expressed Wound bed healthy Irrigated with 1% plain lidocaine 5 cc. No further packing required  Dressing applied  LAB: Culture: no growth  A/P: 62 y.o. male with resolved cellulitis/abscess as above s/p I&D on 04/21/13 -Wound care per above -Continue doxycycline -Pain well controlled -Daily dressing changes -RTC prn  Signed, Eula Listen, PA-C [facility] [place], [place] [address] [phone] 04/27/2013 5:36 PM

## 2013-05-07 ENCOUNTER — Emergency Department (HOSPITAL_BASED_OUTPATIENT_CLINIC_OR_DEPARTMENT_OTHER)
Admission: EM | Admit: 2013-05-07 | Discharge: 2013-05-07 | Disposition: A | Discharge status: Home / Self Care | Payer: BC Managed Care – PPO | Attending: Emergency Medicine | Admitting: Emergency Medicine

## 2013-05-07 ENCOUNTER — Encounter (HOSPITAL_BASED_OUTPATIENT_CLINIC_OR_DEPARTMENT_OTHER): Payer: Self-pay | Admitting: Emergency Medicine

## 2013-05-07 DIAGNOSIS — B9789 Other viral agents as the cause of diseases classified elsewhere: Secondary | ICD-10-CM | POA: Insufficient documentation

## 2013-05-07 DIAGNOSIS — J029 Acute pharyngitis, unspecified: Secondary | ICD-10-CM | POA: Insufficient documentation

## 2013-05-07 DIAGNOSIS — Z79899 Other long term (current) drug therapy: Secondary | ICD-10-CM | POA: Insufficient documentation

## 2013-05-07 DIAGNOSIS — R634 Abnormal weight loss: Secondary | ICD-10-CM | POA: Insufficient documentation

## 2013-05-07 DIAGNOSIS — R599 Enlarged lymph nodes, unspecified: Secondary | ICD-10-CM | POA: Insufficient documentation

## 2013-05-07 DIAGNOSIS — B349 Viral infection, unspecified: Secondary | ICD-10-CM

## 2013-05-07 MED ORDER — MAGIC MOUTHWASH W/LIDOCAINE: ORAL | Status: DC

## 2013-05-07 MED ORDER — HYDROCODONE-ACETAMINOPHEN 7.5-325 MG/15ML PO SOLN: 10.0000 mL | Freq: Four times a day (QID) | ORAL | Status: DC | PRN

## 2013-05-07 MED ORDER — HYDROCODONE-ACETAMINOPHEN 7.5-325 MG/15ML PO SOLN
10.0000 mL | Freq: Once | ORAL | Status: AC
  Administered 2013-05-07: 10 mL via ORAL
  Filled 2013-05-07: qty 15

## 2013-05-07 NOTE — ED Notes (Signed)
Multiple White/yellowish small circular rashes noted in upper palate. Pt report increased in mucus production and difficulty swallowing

## 2013-05-07 NOTE — ED Provider Notes (Signed)
Medical screening examination/treatment/procedure(s) were conducted as a shared visit with non-physician practitioner(s) and myself.  I personally evaluated the patient during the encounter.  EKG Interpretation   None       Pt with what looks like viral pharyngitis with lesions over the pharynx without complicating features.  No hand or foot involvement.  Gwyneth Sprout, MD 05/07/13 [phone]

## 2013-05-07 NOTE — ED Notes (Signed)
Pt c/o sore throat x4days.

## 2013-05-07 NOTE — ED Notes (Signed)
MD at bedside. 

## 2013-05-07 NOTE — ED Provider Notes (Signed)
CSN: [id number]     Arrival date & time 05/07/13  2129 History   First MD Initiated Contact with Patient 05/07/13 2220     Chief Complaint  Patient presents with  . Sore Throat   (Consider location/radiation/quality/duration/timing/severity/associated sxs/prior Treatment) Patient is a 62 y.o. male presenting with pharyngitis. The history is provided by the patient and the spouse. No language interpreter was used.  Sore Throat This is a new problem. The current episode started in the past 7 days. Associated symptoms include a sore throat. Pertinent negatives include no chest pain, chills, congestion, coughing, fever, myalgias or nausea. Associated symptoms comments: Progressively painful sore throat for the past 4-5 days. He denies fever, however, has been alternating ibuprofen and Tylenol on a regular basis. No significant nasal congestion, no cough. He is not vomiting. He has a decreased PO intake secondary to painful swallowing and, per his wife, has had a significant weight loss since symptoms began. Marland Kitchen    History reviewed. No pertinent past medical history. Past Surgical History  Procedure Laterality Date  . Total knee arthroplasty Bilateral    Family History  Problem Relation Age of Onset  . Dementia Mother   . Parkinson's disease Father    History  Substance Use Topics  . Smoking status: Never Smoker   . Smokeless tobacco: Not on file  . Alcohol Use: Yes    Review of Systems  Constitutional: Negative for fever and chills.  HENT: Positive for sore throat and trouble swallowing. Negative for congestion and voice change.   Respiratory: Negative.  Negative for cough.   Cardiovascular: Negative.  Negative for chest pain.  Gastrointestinal: Negative.  Negative for nausea.  Musculoskeletal: Negative.  Negative for myalgias.  Skin: Negative.   Neurological: Negative.     Allergies  Sulfa antibiotics  Home Medications   Current Outpatient Rx  Name  Route  Sig  Dispense   Refill  . ibuprofen (ADVIL,MOTRIN) 200 MG tablet   Oral   Take 200 mg by mouth every 6 (six) hours as needed.         Marland Kitchen omeprazole (PRILOSEC) 20 MG capsule   Oral   Take 20 mg by mouth daily.         . Vitamins-Lipotropics (LIPOFLAVONOID PO)   Oral   Take by mouth.          BP 160/106  Pulse 70  Temp(Src) 98.7 F (37.1 C) (Oral)  Resp 16  Ht 5\' 9"  (1.753 m)  Wt 199 lb (90.266 kg)  BMI 29.37 kg/m2  SpO2 99% Physical Exam  Constitutional: He is oriented to person, place, and time. He appears well-developed and well-nourished.  HENT:  Head: Normocephalic.  Mouth/Throat: Mucous membranes are not dry.  Multiple ulcerations to oropharynx and uvula without tonsillar swelling.   Neck: Normal range of motion. Neck supple.  Cardiovascular: Normal rate and regular rhythm.   Pulmonary/Chest: Effort normal and breath sounds normal.  Abdominal: Soft. Bowel sounds are normal. There is no tenderness. There is no rebound and no guarding.  Musculoskeletal: Normal range of motion.  Lymphadenopathy:    He has cervical adenopathy.  Neurological: He is alert and oriented to person, place, and time.  Skin: Skin is warm and dry. No rash noted.  Psychiatric: He has a normal mood and affect.    ED Course  Procedures (including critical care time) Labs Review Labs Reviewed - No data to display Imaging Review No results found.  EKG Interpretation   None  MDM  No diagnosis found. 1. Pharyngitis  Symptomatic treatment of sore throat, probably viral source. Push fluids.     Arnoldo Hooker, PA-C 05/07/13 2250

## 2013-08-28 ENCOUNTER — Encounter: Payer: Self-pay | Admitting: Family Medicine

## 2013-08-28 ENCOUNTER — Ambulatory Visit (INDEPENDENT_AMBULATORY_CARE_PROVIDER_SITE_OTHER): Payer: BC Managed Care – PPO | Admitting: Family Medicine

## 2013-08-28 VITALS — BP 122/70 | HR 77 | Temp 98.3°F | Ht 70.0 in | Wt 204.0 lb

## 2013-08-28 DIAGNOSIS — R059 Cough, unspecified: Secondary | ICD-10-CM

## 2013-08-28 DIAGNOSIS — R259 Unspecified abnormal involuntary movements: Secondary | ICD-10-CM

## 2013-08-28 DIAGNOSIS — R05 Cough: Secondary | ICD-10-CM

## 2013-08-28 DIAGNOSIS — R251 Tremor, unspecified: Secondary | ICD-10-CM

## 2013-08-28 MED ORDER — AZITHROMYCIN 250 MG PO TABS: ORAL_TABLET | ORAL | Status: DC

## 2013-08-28 MED ORDER — BENZONATATE 200 MG PO CAPS: 200.0000 mg | ORAL_CAPSULE | Freq: Three times a day (TID) | ORAL | Status: DC | PRN

## 2013-08-28 NOTE — Progress Notes (Signed)
Pre visit review using our clinic review tool, if applicable. No additional management support is needed unless otherwise documented below in the visit note.  H/o tremor. FH parkinsons noted but patient has no sx other than the tremor.  Has been going on for years, slightly worse recently.  Still able to work, no meds.  D/w pt about options today.  Never had neuro eval.   Recent cough/URI sx.  duration of symptoms:2 weeks rhinorrhea:yes congestion:yes ear pain:no sore throat:initially Cough:yes, some sputum, not SOB Myalgias: no Sx improved recently.   ROS: See HPI.  Otherwise negative.    Meds, vitals, and allergies reviewed.   GEN: nad, alert and oriented HEENT: mucous membranes moist, TM w/o erythema, nasal epithelium injected, OP with cobblestoning NECK: supple w/o LA CV: rrr. PULM: ctab, no inc wob ABD: soft, +bs EXT: no edema Faint B hand tremor and head tremor noted.

## 2013-08-28 NOTE — Patient Instructions (Signed)
Schedule a physical for fall of 2015.  Use the tessalon for cough and if not better in a few days, then start the zithromax.  Take care.  Glad to see you.

## 2013-08-29 ENCOUNTER — Encounter: Payer: Self-pay | Admitting: Family Medicine

## 2013-08-29 DIAGNOSIS — R059 Cough, unspecified: Secondary | ICD-10-CM | POA: Insufficient documentation

## 2013-08-29 DIAGNOSIS — R05 Cough: Secondary | ICD-10-CM | POA: Insufficient documentation

## 2013-08-29 DIAGNOSIS — R251 Tremor, unspecified: Secondary | ICD-10-CM | POA: Insufficient documentation

## 2013-08-29 NOTE — Assessment & Plan Note (Signed)
Recently improved, use tessalon for cough for now and then start zmax should sx continue/worsen. He agrees. Nontoxic.

## 2013-08-29 NOTE — Assessment & Plan Note (Signed)
We can refer to neuro if needed.  D/w pt.  He'll consider.

## 2014-03-28 ENCOUNTER — Emergency Department (HOSPITAL_COMMUNITY)
Admission: EM | Admit: 2014-03-28 | Discharge: 2014-03-28 | Disposition: A | Discharge status: Home / Self Care | Payer: BC Managed Care – PPO | Attending: Emergency Medicine | Admitting: Emergency Medicine

## 2014-03-28 ENCOUNTER — Emergency Department (HOSPITAL_COMMUNITY): Payer: BC Managed Care – PPO

## 2014-03-28 ENCOUNTER — Encounter (HOSPITAL_COMMUNITY): Payer: Self-pay

## 2014-03-28 DIAGNOSIS — S8012XA Contusion of left lower leg, initial encounter: Secondary | ICD-10-CM | POA: Insufficient documentation

## 2014-03-28 DIAGNOSIS — Z8669 Personal history of other diseases of the nervous system and sense organs: Secondary | ICD-10-CM | POA: Insufficient documentation

## 2014-03-28 DIAGNOSIS — W2209XA Striking against other stationary object, initial encounter: Secondary | ICD-10-CM | POA: Insufficient documentation

## 2014-03-28 DIAGNOSIS — K219 Gastro-esophageal reflux disease without esophagitis: Secondary | ICD-10-CM | POA: Insufficient documentation

## 2014-03-28 DIAGNOSIS — Y998 Other external cause status: Secondary | ICD-10-CM | POA: Insufficient documentation

## 2014-03-28 DIAGNOSIS — Y9301 Activity, walking, marching and hiking: Secondary | ICD-10-CM | POA: Insufficient documentation

## 2014-03-28 DIAGNOSIS — Y9289 Other specified places as the place of occurrence of the external cause: Secondary | ICD-10-CM | POA: Diagnosis not present

## 2014-03-28 DIAGNOSIS — I10 Essential (primary) hypertension: Secondary | ICD-10-CM | POA: Diagnosis not present

## 2014-03-28 DIAGNOSIS — S8992XA Unspecified injury of left lower leg, initial encounter: Secondary | ICD-10-CM | POA: Diagnosis present

## 2014-03-28 DIAGNOSIS — T1490XA Injury, unspecified, initial encounter: Secondary | ICD-10-CM

## 2014-03-28 DIAGNOSIS — Z8781 Personal history of (healed) traumatic fracture: Secondary | ICD-10-CM | POA: Diagnosis not present

## 2014-03-28 DIAGNOSIS — Z79899 Other long term (current) drug therapy: Secondary | ICD-10-CM | POA: Insufficient documentation

## 2014-03-28 MED ORDER — HYDROCODONE-ACETAMINOPHEN 5-325 MG PO TABS
1.0000 | ORAL_TABLET | Freq: Once | ORAL | Status: AC
  Administered 2014-03-28: 1 via ORAL
  Filled 2014-03-28: qty 1

## 2014-03-28 MED ORDER — CEPHALEXIN 500 MG PO CAPS: 1000.0000 mg | ORAL_CAPSULE | Freq: Two times a day (BID) | ORAL | Status: DC

## 2014-03-28 MED ORDER — TRAMADOL HCL 50 MG PO TABS: 50.0000 mg | ORAL_TABLET | Freq: Four times a day (QID) | ORAL | Status: DC | PRN

## 2014-03-28 NOTE — ED Notes (Signed)
Per pt, has had previous leg surgery with steel rod.  Pt bumped his leg today against a piece of steel.  Pt here now with pain and swelling to left lower leg.  Pt pain limiting mobility.  Pt does not take blood thinners.
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
## 2014-03-28 NOTE — Discharge Instructions (Signed)
Please apply ice pack to affected area for 10-15 min each several times daily.  Keep a wrapping on the affected area to decrease swelling.  Keep your leg elevated.  If you notice signs of infection then take antibiotic as prescribed.  Follow up with your orthopedic doctor as needed.    Contusion A contusion is the result of an injury to the skin and underlying tissues and is usually caused by direct trauma. The injury results in the appearance of a bruise on the skin overlying the injured tissues. Contusions cause rupture and bleeding of the small capillaries and blood vessels and affect function, because the bleeding infiltrates muscles, tendons, nerves, or other soft tissues.  SYMPTOMS   Swelling and often a hard lump in the injured area, either superficial or deep.  Pain and tenderness over the area of the contusion.  Feeling of firmness when pressure is exerted over the contusion.  Discoloration under the skin, beginning with redness and progressing to the characteristic "black and blue" bruise. CAUSES  A contusion is typically the result of direct trauma. This is often by a blunt object.  RISK INCREASES WITH:  Sports that have a high likelihood of trauma (football, boxing, ice hockey, soccer, field hockey, martial arts, basketball, and baseball).  Sports that make falling from a height likely (high-jumping, pole-vaulting, skating, or gymnastics).  Any bleeding disorder (hemophilia) or taking medications that affect clotting (aspirin, nonsteroidal anti-inflammatory medications, or warfarin [Coumadin]).  Inadequate protection of exposed areas during contact sports. PREVENTION  Maintain physical fitness:  Joint and muscle flexibility.  Strength and endurance.  Coordination.  Wear proper protective equipment. Make sure it fits correctly. PROGNOSIS  Contusions typically heal without any complications. Healing time varies with the severity of injury and intake of medications that  affect clotting. Contusions usually heal in 1 to 4 weeks. RELATED COMPLICATIONS   Damage to nearby nerves or blood vessels, causing numbness, coldness, or paleness.  Compartment syndrome.  Bleeding into the soft tissues that leads to disability.  Infiltrative-type bleeding, leading to the calcification and impaired function of the injured muscle (rare).  Prolonged healing time if usual activities are resumed too soon.  Infection if the skin over the injury site is broken.  Fracture of the bone underlying the contusion.  Stiffness in the joint where the injured muscle crosses. TREATMENT  Treatment initially consists of resting the injured area as well as medication and ice to reduce inflammation. The use of a compression bandage may also be helpful in minimizing inflammation. As pain diminishes and movement is tolerated, the joint where the affected muscle crosses should be moved to prevent stiffness and the shortening (contracture) of the joint. Movement of the joint should begin as soon as possible. It is also important to work on maintaining strength within the affected muscles. Occasionally, extra padding over the area of contusion may be recommended before returning to sports, particularly if re-injury is likely.  MEDICATION   If pain relief is necessary these medications are often recommended:  Nonsteroidal anti-inflammatory medications, such as aspirin and ibuprofen.  Other minor pain relievers, such as acetaminophen, are often recommended.  Prescription pain relievers may be given by your caregiver. Use only as directed and only as much as you need. HEAT AND COLD  Cold treatment (icing) relieves pain and reduces inflammation. Cold treatment should be applied for 10 to 15 minutes every 2 to 3 hours for inflammation and pain and immediately after any activity that aggravates your symptoms. Use ice packs  or an ice massage. (To do an ice massage fill a large styrofoam cup with  water and freeze. Tear a small amount of foam from the top so ice protrudes. Massage ice firmly over the injured area in a circle about the size of a softball.)  Heat treatment may be used prior to performing the stretching and strengthening activities prescribed by your caregiver, physical therapist, or athletic trainer. Use a heat pack or a warm soak. SEEK MEDICAL CARE IF:   Symptoms get worse or do not improve despite treatment in a few days.  You have difficulty moving a joint.  Any extremity becomes extremely painful, numb, pale, or cool (This is an emergency!).  Medication produces any side effects (bleeding, upset stomach, or allergic reaction).  Signs of infection (drainage from skin, headache, muscle aches, dizziness, fever, or general ill feeling) occur if skin was broken. Document Released: 05/03/2005 Document Revised: 07/26/2011 Document Reviewed: 08/15/2008 [facility]: [facility] [employer] Patient Information 2015 [employer], [place]. This information is not intended to replace advice given to you by your health care provider. Make sure you discuss any questions you have with your health care provider.

## 2015-08-17 ENCOUNTER — Emergency Department (HOSPITAL_COMMUNITY)
Admission: EM | Admit: 2015-08-17 | Discharge: 2015-08-17 | Disposition: A | Discharge status: Home / Self Care | Payer: BLUE CROSS/BLUE SHIELD | Attending: Emergency Medicine | Admitting: Emergency Medicine

## 2015-08-17 ENCOUNTER — Emergency Department (HOSPITAL_COMMUNITY): Payer: BLUE CROSS/BLUE SHIELD

## 2015-08-17 ENCOUNTER — Encounter (HOSPITAL_COMMUNITY): Payer: Self-pay | Admitting: *Deleted

## 2015-08-17 DIAGNOSIS — S20221A Contusion of right back wall of thorax, initial encounter: Secondary | ICD-10-CM | POA: Insufficient documentation

## 2015-08-17 DIAGNOSIS — S29001A Unspecified injury of muscle and tendon of front wall of thorax, initial encounter: Secondary | ICD-10-CM | POA: Diagnosis not present

## 2015-08-17 DIAGNOSIS — Y9389 Activity, other specified: Secondary | ICD-10-CM | POA: Insufficient documentation

## 2015-08-17 DIAGNOSIS — Z79899 Other long term (current) drug therapy: Secondary | ICD-10-CM | POA: Insufficient documentation

## 2015-08-17 DIAGNOSIS — Y92007 Garden or yard of unspecified non-institutional (private) residence as the place of occurrence of the external cause: Secondary | ICD-10-CM | POA: Diagnosis not present

## 2015-08-17 DIAGNOSIS — W11XXXA Fall on and from ladder, initial encounter: Secondary | ICD-10-CM | POA: Diagnosis not present

## 2015-08-17 DIAGNOSIS — I10 Essential (primary) hypertension: Secondary | ICD-10-CM | POA: Insufficient documentation

## 2015-08-17 DIAGNOSIS — R0781 Pleurodynia: Secondary | ICD-10-CM

## 2015-08-17 DIAGNOSIS — K219 Gastro-esophageal reflux disease without esophagitis: Secondary | ICD-10-CM | POA: Insufficient documentation

## 2015-08-17 DIAGNOSIS — S29002A Unspecified injury of muscle and tendon of back wall of thorax, initial encounter: Secondary | ICD-10-CM | POA: Diagnosis present

## 2015-08-17 DIAGNOSIS — Y990 Civilian activity done for income or pay: Secondary | ICD-10-CM | POA: Insufficient documentation

## 2015-08-17 DIAGNOSIS — Z792 Long term (current) use of antibiotics: Secondary | ICD-10-CM | POA: Diagnosis not present

## 2015-08-17 DIAGNOSIS — W19XXXA Unspecified fall, initial encounter: Secondary | ICD-10-CM

## 2015-08-17 MED ORDER — OXYCODONE-ACETAMINOPHEN 5-325 MG PO TABS
2.0000 | ORAL_TABLET | Freq: Once | ORAL | Status: AC
  Administered 2015-08-17: 2 via ORAL
  Filled 2015-08-17: qty 2

## 2015-08-17 MED ORDER — OXYCODONE-ACETAMINOPHEN 5-325 MG PO TABS: 1.0000 | ORAL_TABLET | ORAL | Status: DC | PRN

## 2015-08-17 NOTE — Discharge Instructions (Signed)
Take the prescribed medication as directed.  May use additional motrin between doses if needed. Follow-up with your primary care physician. Return to the ED for new or worsening symptoms.

## 2015-08-17 NOTE — ED Notes (Signed)
Pt complains of pain in his right lower back since falling from a ladder ~3 feet from the ground onto a garden tub. Pt has bruise on right back/flank. Pt states pain is worse with movement and has limited ROM. Pt took ibuprofen for pain with no relief, last dose was this morning.
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
## 2018-05-27 DIAGNOSIS — H2513 Age-related nuclear cataract, bilateral: Secondary | ICD-10-CM | POA: Diagnosis not present

## 2018-05-27 DIAGNOSIS — H40033 Anatomical narrow angle, bilateral: Secondary | ICD-10-CM | POA: Diagnosis not present

## 2019-05-14 DIAGNOSIS — J029 Acute pharyngitis, unspecified: Secondary | ICD-10-CM | POA: Diagnosis not present

## 2019-05-14 DIAGNOSIS — Z20828 Contact with and (suspected) exposure to other viral communicable diseases: Secondary | ICD-10-CM | POA: Diagnosis not present

## 2019-05-14 DIAGNOSIS — R509 Fever, unspecified: Secondary | ICD-10-CM | POA: Diagnosis not present

## 2019-05-14 DIAGNOSIS — B370 Candidal stomatitis: Secondary | ICD-10-CM | POA: Diagnosis not present

## 2019-06-05 DIAGNOSIS — H2513 Age-related nuclear cataract, bilateral: Secondary | ICD-10-CM | POA: Diagnosis not present

## 2019-06-05 DIAGNOSIS — H40033 Anatomical narrow angle, bilateral: Secondary | ICD-10-CM | POA: Diagnosis not present

## 2019-06-08 ENCOUNTER — Observation Stay (HOSPITAL_COMMUNITY)
Admission: EM | Admit: 2019-06-08 | Discharge: 2019-06-09 | Disposition: A | Discharge status: Home / Self Care | Payer: Medicare Other | Attending: Student | Admitting: Student

## 2019-06-08 ENCOUNTER — Emergency Department (HOSPITAL_COMMUNITY): Payer: Medicare Other

## 2019-06-08 ENCOUNTER — Other Ambulatory Visit: Payer: Self-pay

## 2019-06-08 ENCOUNTER — Encounter (HOSPITAL_COMMUNITY): Payer: Self-pay | Admitting: Emergency Medicine

## 2019-06-08 DIAGNOSIS — Z96653 Presence of artificial knee joint, bilateral: Secondary | ICD-10-CM | POA: Insufficient documentation

## 2019-06-08 DIAGNOSIS — N1831 Chronic kidney disease, stage 3a: Secondary | ICD-10-CM | POA: Diagnosis not present

## 2019-06-08 DIAGNOSIS — Z794 Long term (current) use of insulin: Secondary | ICD-10-CM | POA: Insufficient documentation

## 2019-06-08 DIAGNOSIS — R634 Abnormal weight loss: Secondary | ICD-10-CM | POA: Diagnosis not present

## 2019-06-08 DIAGNOSIS — Z79899 Other long term (current) drug therapy: Secondary | ICD-10-CM | POA: Diagnosis not present

## 2019-06-08 DIAGNOSIS — Z8616 Personal history of COVID-19: Secondary | ICD-10-CM | POA: Insufficient documentation

## 2019-06-08 DIAGNOSIS — R531 Weakness: Secondary | ICD-10-CM | POA: Diagnosis not present

## 2019-06-08 DIAGNOSIS — N179 Acute kidney failure, unspecified: Secondary | ICD-10-CM | POA: Insufficient documentation

## 2019-06-08 DIAGNOSIS — E1122 Type 2 diabetes mellitus with diabetic chronic kidney disease: Secondary | ICD-10-CM | POA: Diagnosis not present

## 2019-06-08 DIAGNOSIS — Z8249 Family history of ischemic heart disease and other diseases of the circulatory system: Secondary | ICD-10-CM | POA: Diagnosis not present

## 2019-06-08 DIAGNOSIS — E1165 Type 2 diabetes mellitus with hyperglycemia: Secondary | ICD-10-CM | POA: Diagnosis not present

## 2019-06-08 DIAGNOSIS — E1100 Type 2 diabetes mellitus with hyperosmolarity without nonketotic hyperglycemic-hyperosmolar coma (NKHHC): Secondary | ICD-10-CM | POA: Diagnosis not present

## 2019-06-08 DIAGNOSIS — H9319 Tinnitus, unspecified ear: Secondary | ICD-10-CM | POA: Insufficient documentation

## 2019-06-08 DIAGNOSIS — Z820 Family history of epilepsy and other diseases of the nervous system: Secondary | ICD-10-CM | POA: Diagnosis not present

## 2019-06-08 DIAGNOSIS — Z791 Long term (current) use of non-steroidal anti-inflammatories (NSAID): Secondary | ICD-10-CM | POA: Diagnosis not present

## 2019-06-08 DIAGNOSIS — K219 Gastro-esophageal reflux disease without esophagitis: Secondary | ICD-10-CM | POA: Diagnosis not present

## 2019-06-08 DIAGNOSIS — B370 Candidal stomatitis: Secondary | ICD-10-CM | POA: Diagnosis present

## 2019-06-08 DIAGNOSIS — Z6825 Body mass index (BMI) 25.0-25.9, adult: Secondary | ICD-10-CM | POA: Insufficient documentation

## 2019-06-08 DIAGNOSIS — Z20822 Contact with and (suspected) exposure to covid-19: Secondary | ICD-10-CM | POA: Diagnosis not present

## 2019-06-08 DIAGNOSIS — E876 Hypokalemia: Secondary | ICD-10-CM | POA: Diagnosis not present

## 2019-06-08 DIAGNOSIS — E860 Dehydration: Secondary | ICD-10-CM | POA: Diagnosis not present

## 2019-06-08 DIAGNOSIS — Z882 Allergy status to sulfonamides status: Secondary | ICD-10-CM | POA: Diagnosis not present

## 2019-06-08 DIAGNOSIS — I129 Hypertensive chronic kidney disease with stage 1 through stage 4 chronic kidney disease, or unspecified chronic kidney disease: Secondary | ICD-10-CM | POA: Insufficient documentation

## 2019-06-08 DIAGNOSIS — I10 Essential (primary) hypertension: Secondary | ICD-10-CM | POA: Diagnosis present

## 2019-06-08 LAB — CBG MONITORING, ED
Glucose-Capillary: 172 mg/dL — ABNORMAL HIGH (ref 70–99)
Glucose-Capillary: 182 mg/dL — ABNORMAL HIGH (ref 70–99)
Glucose-Capillary: 185 mg/dL — ABNORMAL HIGH (ref 70–99)
Glucose-Capillary: 185 mg/dL — ABNORMAL HIGH (ref 70–99)
Glucose-Capillary: 190 mg/dL — ABNORMAL HIGH (ref 70–99)
Glucose-Capillary: 233 mg/dL — ABNORMAL HIGH (ref 70–99)
Glucose-Capillary: 236 mg/dL — ABNORMAL HIGH (ref 70–99)
Glucose-Capillary: 294 mg/dL — ABNORMAL HIGH (ref 70–99)
Glucose-Capillary: 409 mg/dL — ABNORMAL HIGH (ref 70–99)
Glucose-Capillary: 411 mg/dL — ABNORMAL HIGH (ref 70–99)
Glucose-Capillary: 482 mg/dL — ABNORMAL HIGH (ref 70–99)
Glucose-Capillary: 521 mg/dL (ref 70–99)
Glucose-Capillary: >600 mg/dL (ref 70–99)

## 2019-06-08 LAB — URINALYSIS, ROUTINE W REFLEX MICROSCOPIC
Bacteria, UA: NONE SEEN
Bilirubin Urine: NEGATIVE
Glucose, UA: >=500 mg/dL — AB
Hgb urine dipstick: NEGATIVE
Ketones, ur: NEGATIVE mg/dL
Leukocytes,Ua: NEGATIVE
Nitrite: NEGATIVE
Protein, ur: NEGATIVE mg/dL
Specific Gravity, Urine: 1.027 (ref 1.005–1.030)
pH: 5 (ref 5.0–8.0)

## 2019-06-08 LAB — CBC
HCT: 46.9 % (ref 39.0–52.0)
Hemoglobin: 16.6 g/dL (ref 13.0–17.0)
MCH: 31.3 pg (ref 26.0–34.0)
MCHC: 35.4 g/dL (ref 30.0–36.0)
MCV: 88.3 fL (ref 80.0–100.0)
Platelets: 250 10*3/uL (ref 150–400)
RBC: 5.31 MIL/uL (ref 4.22–5.81)
RDW: 12.2 % (ref 11.5–15.5)
WBC: 7.8 10*3/uL (ref 4.0–10.5)
nRBC: 0 % (ref 0.0–0.2)

## 2019-06-08 LAB — BASIC METABOLIC PANEL
Anion gap: 12 (ref 5–15)
Anion gap: 12 (ref 5–15)
Anion gap: 7 (ref 5–15)
BUN: 18 mg/dL (ref 8–23)
BUN: 18 mg/dL (ref 8–23)
BUN: 15 mg/dL (ref 8–23)
CO2: 22 mmol/L (ref 22–32)
CO2: 20 mmol/L — ABNORMAL LOW (ref 22–32)
CO2: 21 mmol/L — ABNORMAL LOW (ref 22–32)
Calcium: 9.7 mg/dL (ref 8.9–10.3)
Calcium: 9.3 mg/dL (ref 8.9–10.3)
Calcium: 8.1 mg/dL — ABNORMAL LOW (ref 8.9–10.3)
Chloride: 92 mmol/L — ABNORMAL LOW (ref 98–111)
Chloride: 101 mmol/L (ref 98–111)
Chloride: 103 mmol/L (ref 98–111)
Creatinine, Ser: 1.28 mg/dL — ABNORMAL HIGH (ref 0.61–1.24)
Creatinine, Ser: 1.2 mg/dL (ref 0.61–1.24)
Creatinine, Ser: 0.98 mg/dL (ref 0.61–1.24)
GFR calc Af Amer: >60 mL/min (ref >60)
GFR calc Af Amer: >60 mL/min (ref >60)
GFR calc Af Amer: >60 mL/min (ref >60)
GFR calc non Af Amer: 58 mL/min — ABNORMAL LOW (ref >60)
GFR calc non Af Amer: >60 mL/min (ref >60)
GFR calc non Af Amer: >60 mL/min (ref >60)
Glucose, Bld: 803 mg/dL (ref 70–99)
Glucose, Bld: 383 mg/dL — ABNORMAL HIGH (ref 70–99)
Glucose, Bld: 405 mg/dL — ABNORMAL HIGH (ref 70–99)
Potassium: 4.9 mmol/L (ref 3.5–5.1)
Potassium: 4.1 mmol/L (ref 3.5–5.1)
Potassium: 3.4 mmol/L — ABNORMAL LOW (ref 3.5–5.1)
Sodium: 126 mmol/L — ABNORMAL LOW (ref 135–145)
Sodium: 133 mmol/L — ABNORMAL LOW (ref 135–145)
Sodium: 131 mmol/L — ABNORMAL LOW (ref 135–145)

## 2019-06-08 LAB — HEMOGLOBIN A1C
Hgb A1c MFr Bld: 13.4 % — ABNORMAL HIGH (ref 4.8–5.6)
Mean Plasma Glucose: 337.88 mg/dL

## 2019-06-08 LAB — RESPIRATORY PANEL BY RT PCR (FLU A&B, COVID)
Influenza A by PCR: NEGATIVE
Influenza B by PCR: NEGATIVE
SARS Coronavirus 2 by RT PCR: NEGATIVE

## 2019-06-08 LAB — MAGNESIUM: Magnesium: 2.2 mg/dL (ref 1.7–2.4)

## 2019-06-08 LAB — T4, FREE: Free T4: 0.98 ng/dL (ref 0.61–1.12)

## 2019-06-08 LAB — HIV ANTIBODY (ROUTINE TESTING W REFLEX): HIV Screen 4th Generation wRfx: NONREACTIVE

## 2019-06-08 LAB — OSMOLALITY: Osmolality: 315 mOsm/kg — ABNORMAL HIGH (ref 275–295)

## 2019-06-08 LAB — TSH: TSH: 0.89 u[IU]/mL (ref 0.350–4.500)

## 2019-06-08 MED ORDER — SODIUM CHLORIDE 0.9 % IV BOLUS
1000.0000 mL | INTRAVENOUS | Status: AC
  Administered 2019-06-08: 15:00:00 1000 mL via INTRAVENOUS

## 2019-06-08 MED ORDER — DEXTROSE 50 % IV SOLN: 0.0000 mL | INTRAVENOUS | Status: DC | PRN

## 2019-06-08 MED ORDER — SODIUM CHLORIDE 0.9 % IV SOLN: INTRAVENOUS | Status: DC

## 2019-06-08 MED ORDER — INSULIN REGULAR(HUMAN) IN NACL 100-0.9 UT/100ML-% IV SOLN
INTRAVENOUS | Status: DC
  Administered 2019-06-08: 6 [IU]/h via INTRAVENOUS
  Filled 2019-06-08: qty 100

## 2019-06-08 MED ORDER — DEXTROSE-NACL 5-0.45 % IV SOLN: INTRAVENOUS | Status: DC

## 2019-06-08 MED ORDER — PANTOPRAZOLE SODIUM 40 MG PO TBEC
40.0000 mg | DELAYED_RELEASE_TABLET | Freq: Every day | ORAL | Status: DC
  Administered 2019-06-09: 40 mg via ORAL
  Filled 2019-06-08: qty 1

## 2019-06-08 MED ORDER — POTASSIUM CHLORIDE 10 MEQ/100ML IV SOLN
10.0000 meq | INTRAVENOUS | Status: AC
  Administered 2019-06-08 (×2): 10 meq via INTRAVENOUS
  Filled 2019-06-08 (×2): qty 100

## 2019-06-08 MED ORDER — FLUCONAZOLE 100 MG PO TABS
100.0000 mg | ORAL_TABLET | Freq: Every day | ORAL | Status: AC
  Administered 2019-06-08 – 2019-06-09 (×2): 100 mg via ORAL
  Filled 2019-06-08 (×2): qty 1

## 2019-06-08 MED ORDER — ENOXAPARIN SODIUM 40 MG/0.4ML ~~LOC~~ SOLN
40.0000 mg | SUBCUTANEOUS | Status: DC
  Administered 2019-06-08: 40 mg via SUBCUTANEOUS
  Filled 2019-06-08: qty 0.4

## 2019-06-08 MED ORDER — SODIUM CHLORIDE 0.9 % IV BOLUS
1000.0000 mL | Freq: Once | INTRAVENOUS | Status: AC
  Administered 2019-06-08: 11:00:00 1000 mL via INTRAVENOUS

## 2019-06-08 MED ORDER — INSULIN REGULAR(HUMAN) IN NACL 100-0.9 UT/100ML-% IV SOLN
INTRAVENOUS | Status: DC
  Administered 2019-06-08: 3 [IU]/h via INTRAVENOUS
  Filled 2019-06-08: qty 100
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
## 2019-06-08 NOTE — ED Notes (Signed)
Admitting at bedside 

## 2019-06-08 NOTE — ED Notes (Signed)
ED Provider at bedside. 

## 2019-06-08 NOTE — ED Triage Notes (Signed)
Pt reports losing 40 lbs in 2 months. States he is drinking lots of water but it is going right through him. Tested negative for covid 3 weeks ago but had strep and completed abx. States he feels dehydrated.
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
## 2019-06-08 NOTE — ED Notes (Signed)
CBG results at 21:15 , 172 mg/dL
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
## 2019-06-09 DIAGNOSIS — I10 Essential (primary) hypertension: Secondary | ICD-10-CM | POA: Diagnosis not present

## 2019-06-09 DIAGNOSIS — E1165 Type 2 diabetes mellitus with hyperglycemia: Secondary | ICD-10-CM

## 2019-06-09 DIAGNOSIS — E876 Hypokalemia: Secondary | ICD-10-CM | POA: Diagnosis not present

## 2019-06-09 DIAGNOSIS — E1100 Type 2 diabetes mellitus with hyperosmolarity without nonketotic hyperglycemic-hyperosmolar coma (NKHHC): Secondary | ICD-10-CM

## 2019-06-09 DIAGNOSIS — R634 Abnormal weight loss: Secondary | ICD-10-CM | POA: Diagnosis not present

## 2019-06-09 DIAGNOSIS — N179 Acute kidney failure, unspecified: Secondary | ICD-10-CM

## 2019-06-09 LAB — BASIC METABOLIC PANEL
Anion gap: 9 (ref 5–15)
Anion gap: 8 (ref 5–15)
Anion gap: 7 (ref 5–15)
BUN: 15 mg/dL (ref 8–23)
BUN: 13 mg/dL (ref 8–23)
BUN: 11 mg/dL (ref 8–23)
CO2: 20 mmol/L — ABNORMAL LOW (ref 22–32)
CO2: 20 mmol/L — ABNORMAL LOW (ref 22–32)
CO2: 21 mmol/L — ABNORMAL LOW (ref 22–32)
Calcium: 8.5 mg/dL — ABNORMAL LOW (ref 8.9–10.3)
Calcium: 8.2 mg/dL — ABNORMAL LOW (ref 8.9–10.3)
Calcium: 8.1 mg/dL — ABNORMAL LOW (ref 8.9–10.3)
Chloride: 107 mmol/L (ref 98–111)
Chloride: 104 mmol/L (ref 98–111)
Chloride: 103 mmol/L (ref 98–111)
Creatinine, Ser: 0.97 mg/dL (ref 0.61–1.24)
Creatinine, Ser: 0.91 mg/dL (ref 0.61–1.24)
Creatinine, Ser: 0.86 mg/dL (ref 0.61–1.24)
GFR calc Af Amer: >60 mL/min (ref >60)
GFR calc Af Amer: >60 mL/min (ref >60)
GFR calc Af Amer: >60 mL/min (ref >60)
GFR calc non Af Amer: >60 mL/min (ref >60)
GFR calc non Af Amer: >60 mL/min (ref >60)
GFR calc non Af Amer: >60 mL/min (ref >60)
Glucose, Bld: 176 mg/dL — ABNORMAL HIGH (ref 70–99)
Glucose, Bld: 207 mg/dL — ABNORMAL HIGH (ref 70–99)
Glucose, Bld: 238 mg/dL — ABNORMAL HIGH (ref 70–99)
Potassium: 3.2 mmol/L — ABNORMAL LOW (ref 3.5–5.1)
Potassium: 3.2 mmol/L — ABNORMAL LOW (ref 3.5–5.1)
Potassium: 3 mmol/L — ABNORMAL LOW (ref 3.5–5.1)
Sodium: 136 mmol/L (ref 135–145)
Sodium: 132 mmol/L — ABNORMAL LOW (ref 135–145)
Sodium: 131 mmol/L — ABNORMAL LOW (ref 135–145)

## 2019-06-09 LAB — MRSA PCR SCREENING: MRSA by PCR: NEGATIVE

## 2019-06-09 LAB — GLUCOSE, CAPILLARY
Glucose-Capillary: 162 mg/dL — ABNORMAL HIGH (ref 70–99)
Glucose-Capillary: 186 mg/dL — ABNORMAL HIGH (ref 70–99)
Glucose-Capillary: 174 mg/dL — ABNORMAL HIGH (ref 70–99)
Glucose-Capillary: 181 mg/dL — ABNORMAL HIGH (ref 70–99)
Glucose-Capillary: 208 mg/dL — ABNORMAL HIGH (ref 70–99)
Glucose-Capillary: 229 mg/dL — ABNORMAL HIGH (ref 70–99)
Glucose-Capillary: 228 mg/dL — ABNORMAL HIGH (ref 70–99)
Glucose-Capillary: 207 mg/dL — ABNORMAL HIGH (ref 70–99)
Glucose-Capillary: 227 mg/dL — ABNORMAL HIGH (ref 70–99)
Glucose-Capillary: 292 mg/dL — ABNORMAL HIGH (ref 70–99)
Glucose-Capillary: 228 mg/dL — ABNORMAL HIGH (ref 70–99)
Glucose-Capillary: 310 mg/dL — ABNORMAL HIGH (ref 70–99)

## 2019-06-09 MED ORDER — POTASSIUM CHLORIDE CRYS ER 20 MEQ PO TBCR
40.0000 meq | EXTENDED_RELEASE_TABLET | ORAL | Status: DC
  Administered 2019-06-09: 40 meq via ORAL
  Filled 2019-06-09 (×2): qty 2

## 2019-06-09 MED ORDER — LOSARTAN POTASSIUM 25 MG PO TABS: 25.0000 mg | ORAL_TABLET | Freq: Every day | ORAL | 1 refills | Status: DC

## 2019-06-09 MED ORDER — ATORVASTATIN CALCIUM 40 MG PO TABS: 40.0000 mg | ORAL_TABLET | Freq: Every day | ORAL | Status: DC

## 2019-06-09 MED ORDER — "PEN NEEDLES 5/16"" 30G X 8 MM MISC": 1.0000 "pen " | Freq: Two times a day (BID) | 1 refills | Status: DC

## 2019-06-09 MED ORDER — ATORVASTATIN CALCIUM 40 MG PO TABS: 40.0000 mg | ORAL_TABLET | Freq: Every day | ORAL | 1 refills | Status: DC

## 2019-06-09 MED ORDER — LIVING WELL WITH DIABETES BOOK
Freq: Once | Status: AC
  Filled 2019-06-09: qty 1

## 2019-06-09 MED ORDER — METFORMIN HCL 500 MG PO TABS: 500.0000 mg | ORAL_TABLET | Freq: Two times a day (BID) | ORAL | 1 refills | Status: DC

## 2019-06-09 MED ORDER — INSULIN GLARGINE 100 UNIT/ML SOLOSTAR PEN: 15.0000 [IU] | PEN_INJECTOR | Freq: Every day | SUBCUTANEOUS | 11 refills | Status: DC

## 2019-06-09 MED ORDER — INSULIN ASPART 100 UNIT/ML ~~LOC~~ SOLN: 0.0000 [IU] | Freq: Every day | SUBCUTANEOUS | Status: DC

## 2019-06-09 MED ORDER — AMLODIPINE BESYLATE 10 MG PO TABS: 10.0000 mg | ORAL_TABLET | Freq: Every day | ORAL | 1 refills | Status: DC

## 2019-06-09 MED ORDER — INSULIN ASPART 100 UNIT/ML ~~LOC~~ SOLN
4.0000 [IU] | Freq: Three times a day (TID) | SUBCUTANEOUS | Status: DC
  Administered 2019-06-09: 4 [IU] via SUBCUTANEOUS

## 2019-06-09 MED ORDER — BLOOD GLUCOSE MONITOR KIT: PACK | 0 refills | Status: DC

## 2019-06-09 MED ORDER — JARDIANCE 10 MG PO TABS: 10.0000 mg | ORAL_TABLET | Freq: Every day | ORAL | 1 refills | Status: DC

## 2019-06-09 MED ORDER — INSULIN DETEMIR 100 UNIT/ML ~~LOC~~ SOLN
12.0000 [IU] | SUBCUTANEOUS | Status: DC
  Administered 2019-06-09: 12 [IU] via SUBCUTANEOUS
  Filled 2019-06-09: qty 0.12

## 2019-06-09 MED ORDER — AMLODIPINE BESYLATE 10 MG PO TABS
10.0000 mg | ORAL_TABLET | Freq: Every day | ORAL | Status: DC
  Administered 2019-06-09: 10 mg via ORAL
  Filled 2019-06-09: qty 1

## 2019-06-09 MED ORDER — GLUCERNA SHAKE PO LIQD
237.0000 mL | Freq: Three times a day (TID) | ORAL | Status: DC
  Administered 2019-06-09 (×2): 237 mL via ORAL

## 2019-06-09 MED ORDER — HYDRALAZINE HCL 20 MG/ML IJ SOLN
5.0000 mg | Freq: Once | INTRAMUSCULAR | Status: AC
  Administered 2019-06-09: 5 mg via INTRAVENOUS
  Filled 2019-06-09: qty 1

## 2019-06-09 MED ORDER — ONDANSETRON HCL 4 MG/2ML IJ SOLN
4.0000 mg | Freq: Four times a day (QID) | INTRAMUSCULAR | Status: DC | PRN
  Administered 2019-06-09 (×2): 4 mg via INTRAVENOUS
  Filled 2019-06-09 (×2): qty 2

## 2019-06-09 MED ORDER — INSULIN STARTER KIT- PEN NEEDLES (ENGLISH)
1.0000 | Freq: Once | Status: AC
  Administered 2019-06-09: 1
  Filled 2019-06-09: qty 1

## 2019-06-09 MED ORDER — LOSARTAN POTASSIUM 25 MG PO TABS
25.0000 mg | ORAL_TABLET | Freq: Every day | ORAL | Status: DC
  Administered 2019-06-09: 25 mg via ORAL
  Filled 2019-06-09: qty 1

## 2019-06-09 MED ORDER — ADULT MULTIVITAMIN W/MINERALS CH
1.0000 | ORAL_TABLET | Freq: Every day | ORAL | Status: DC
  Administered 2019-06-09: 1 via ORAL
  Filled 2019-06-09: qty 1

## 2019-06-09 MED ORDER — INSULIN ASPART 100 UNIT/ML ~~LOC~~ SOLN
0.0000 [IU] | Freq: Three times a day (TID) | SUBCUTANEOUS | Status: DC
  Administered 2019-06-09: 5 [IU] via SUBCUTANEOUS

## 2019-06-09 NOTE — Discharge Instructions (Signed)
Heart Healthy, Consistent Carbohydrate Nutrition Therapy   A heart-healthy and consistent carbohydrate diet is recommended to manage heart disease and diabetes. To follow a heart-healthy and consistent carbohydrate diet, . Eat a balanced diet with whole grains, fruits and vegetables, and lean protein sources.  . Choose heart-healthy unsaturated fats. Limit saturated fats, trans fats, and cholesterol intake. Eat more plant-based or vegetarian meals using beans and soy foods for protein.  . Eat whole, unprocessed foods to limit the amount of sodium (salt) you eat.  . Choose a consistent amount of carbohydrate at each meal and snack. Limit refined carbohydrates especially sugar, sweets and sugar-sweetened beverages.  . If you drink alcohol, do so in moderation: one serving per day (women) and two servings per day (men). o One serving is equivalent to 12 ounces beer, 5 ounces wine, or 1.5 ounces distilled spirits  Tips Tips for Choosing Heart-Healthy Fats Choose lean protein and low-fat dairy foods to reduce saturated fat intake. . Saturated fat is usually found in animal-based protein and is associated with certain health risks. Saturated fat is the biggest contributor to raise low-density lipoprotein (LDL) cholesterol levels. Research shows that limiting saturated fat lowers unhealthy cholesterol levels. Eat no more than 7% of your total calories each day from saturated fat. Ask your RDN to help you determine how much saturated fat is right for you. . There are many foods that do not contain large amounts of saturated fats. Swapping these foods to replace foods high in saturated fats will help you limit the saturated fat you eat and improve your cholesterol levels. You can also try eating more plant-based or vegetarian meals. Instead of. Try:  Whole milk, cheese, yogurt, and ice cream 1% or skim milk, low-fat cheese, non-fat yogurt, and low-fat ice cream  Fatty, marbled beef and pork Lean beef, pork,  or venison  Poultry with skin Poultry without skin  Butter, stick margarine Reduced-fat, whipped, or liquid spreads  Coconut oil, palm oil Liquid vegetable oils: corn, canola, olive, soybean and safflower oils   Avoid foods that contain trans fats. . Trans fats increase levels of LDL-cholesterol. Hydrogenated fat in processed foods is the main source of trans fats in foods.  . Trans fats can be found in stick margarine, shortening, processed sweets, baked goods, some fried foods, and packaged foods made with hydrogenated oils. Avoid foods with "partially hydrogenated oil" on the ingredient list such as: cookies, pastries, baked goods, biscuits, crackers, microwave popcorn, and frozen dinners. Choose foods with heart healthy fats. . Polyunsaturated and monounsaturated fat are unsaturated fats that may help lower your blood cholesterol level when used in place of saturated fat in your diet. . Ask your RDN about taking a dietary supplement with plant sterols and stanols to help lower your cholesterol level. Marland Kitchen Research shows that substituting saturated fats with unsaturated fats is beneficial to cholesterol levels. Try these easy swaps: Instead of. Try:  Butter, stick margarine, or solid shortening Reduced-fat, whipped, or liquid spreads  Beef, pork, or poultry with skin Fish and seafood  Chips, crackers, snack foods Raw or unsalted nuts and seeds or nut butters Hummus with vegetables Avocado on toast  Coconut oil, palm oil Liquid vegetable oils: corn, canola, olive, soybean and safflower oils  Limit the amount of cholesterol you eat to less than 200 milligrams per day. . Cholesterol is a substance carried through the bloodstream via lipoproteins, which are known as "transporters" of fat. Some body functions need cholesterol to work properly, but too much  cholesterol in the bloodstream can damage arteries and build up blood vessel linings (which can lead to heart attack and stroke). You should eat  less than 200 milligrams cholesterol per day. Marland Kitchen People respond differently to eating cholesterol. There is no test available right now that can figure out which people will respond more to dietary cholesterol and which will respond less. For individuals with high intake of dietary cholesterol, different types of increase (none, small, moderate, large) in LDL-cholesterol levels are all possible.  . Food sources of cholesterol include egg yolks and organ meats such as liver, gizzards. Limit egg yolks to two to four per week and avoid organ meats like liver and gizzards to control cholesterol intake. Tips for Choosing Heart-Healthy Carbohydrates Consume a consistent amount of carbohydrate . It is important to eat foods with carbohydrates in moderation because they impact your blood glucose level. Carbohydrates can be found in many foods such as: . Grains (breads, crackers, rice, pasta, and cereals)  . Starchy Vegetables (potatoes, corn, and peas)  . Beans and legumes  . Milk, soy milk, and yogurt  . Fruit and fruit juice  . Sweets (cakes, cookies, ice cream, jam and jelly) . Your RDN will help you set a goal for how many carbohydrate servings to eat at your meals and snacks. For many adults, eating 3 to 5 servings of carbohydrate foods at each meal and 1 or 2 carbohydrate servings for each snack works well.  . Check your blood glucose level regularly. It can tell you if you need to adjust when you eat carbohydrates. . Choose foods rich in viscous (soluble) fiber . Viscous, or soluble, is found in the walls of plant cells. Viscous fiber is found only in plant-based foods. Eating foods with fiber helps to lower your unhealthy cholesterol and keep your blood glucose in range  . Rich sources of viscous fiber include vegetables (asparagus, Brussels sprouts, sweet potatoes, turnips) fruit (apricots, mangoes, oranges), legumes, and whole grains (barley, oats, and oat bran).  . As you increase your fiber  intake gradually, also increase the amount of water you drink. This will help prevent constipation.  . If you have difficulty achieving this goal, ask your RDN about fiber laxatives. Choose fiber supplements made with viscous fibers such as psyllium seed husks or methylcellulose to help lower unhealthy cholesterol.  . Limit refined carbohydrates  . There are three types of carbohydrates: starches, sugar, and fiber. Some carbohydrates occur naturally in food, like the starches in rice or corn or the sugars in fruits and milk. Refined carbohydrates--foods with high amounts of simple sugars--can raise triglyceride levels. High triglyceride levels are associated with coronary heart disease. . Some examples of refined carbohydrate foods are table sugar, sweets, and beverages sweetened with added sugar. Tips for Reducing Sodium (Salt) Although sodium is important for your body to function, too much sodium can be harmful for people with high blood pressure. As sodium and fluid buildup in your tissues and bloodstream, your blood pressure increases. High blood pressure may cause damage to other organs and increase your risk for a stroke. Even if you take a pill for blood pressure or a water pill (diuretic) to remove fluid, it is still important to have less salt in your diet. Ask your doctor and RDN what amount of sodium is right for you. Marland Kitchen Avoid processed foods. Eat more fresh foods.  . Fresh fruits and vegetables are naturally low in sodium, as well as frozen vegetables and fruits that have  no added juices or sauces.  . Fresh meats are lower in sodium than processed meats, such as bacon, sausage, and hotdogs. Read the nutrition label or ask your butcher to help you find a fresh meat that is low in sodium. . Eat less salt--at the table and when cooking.  . A single teaspoon of table salt has 2,300 mg of sodium.  . Leave the salt out of recipes for pasta, casseroles, and soups.  . Ask your RDN how to cook your  favorite recipes without sodium . Be a Paramedic.  . Look for food packages that say "salt-free" or "sodium-free." These items contain less than 5 milligrams of sodium per serving.  Marland Kitchen "Very low-sodium" products contain less than 35 milligrams of sodium per serving.  Marland Kitchen "Low-sodium" products contain less than 140 milligrams of sodium per serving.  . Beware for "Unsalted" or "No Added Salt" products. These items may still be high in sodium. Check the nutrition label. . Add flavors to your food without adding sodium.  . Try lemon juice, lime juice, fruit juice or vinegar.  . Dry or fresh herbs add flavor. Try basil, bay leaf, dill, rosemary, parsley, sage, dry mustard, nutmeg, thyme, and paprika.  . Pepper, red pepper flakes, and cayenne pepper can add spice t your meals without adding sodium. Hot sauce contains sodium, but if you use just a drop or two, it will not add up to much.  Sharyn Lull a sodium-free seasoning blend or make your own at home. Additional Lifestyle Tips Achieve and maintain a healthy weight. . Talk with your RDN or your doctor about what is a healthy weight for you. . Set goals to reach and maintain that weight.  . To lose weight, reduce your calorie intake along with increasing your physical activity. A weight loss of 10 to 15 pounds could reduce LDL-cholesterol by 5 milligrams per deciliter. Participate in physical activity. . Talk with your health care team to find out what types of physical activity are best for you. Set a plan to get about 30 minutes of exercise on most days.  Foods Recommended Food Group Foods Recommended  Grains Whole grain breads and cereals, including whole wheat, barley, rye, buckwheat, corn, teff, quinoa, millet, amaranth, brown or wild rice, sorghum, and oats Pasta, especially whole wheat or other whole grain types  [employer], quinoa or wild rice Whole grain crackers, bread, rolls, pitas Home-made bread with reduced-sodium baking soda  Protein  Foods Lean cuts of beef and pork (loin, leg, round, extra lean hamburger)  Skinless Cytogeneticist and other wild game Dried beans and peas Nuts and nut butters Meat alternatives made with soy or textured vegetable protein  Egg whites or egg substitute Cold cuts made with lean meat or soy protein  Dairy Nonfat (skim), low-fat, or 1%-fat milk  Nonfat or low-fat yogurt or cottage cheese Fat-free and low-fat cheese  Vegetables Fresh, frozen, or canned vegetables without added fat or salt   Fruits Fresh, frozen, canned, or dried fruit   Oils Unsaturated oils (corn, olive, peanut, soy, sunflower, canola)  Soft or liquid margarines and vegetable oil spreads  Salad dressings Seeds and nuts  Avocado   Foods Not Recommended Food Group Foods Not Recommended  Grains Breads or crackers topped with salt Cereals (hot or cold) with more than 300 mg sodium per serving Biscuits, cornbread, and other "quick" breads prepared with baking soda Bread crumbs or stuffing mix from a store High-fat bakery products, such as  doughnuts, biscuits, croissants, danish pastries, pies, cookies Instant cooking foods to which you add hot water and stir--potatoes, noodles, rice, etc. Packaged starchy foods--seasoned noodle or rice dishes, stuffing mix, macaroni and cheese dinner Snacks made with partially hydrogenated oils, including chips, cheese puffs, snack mixes, regular crackers, butter-flavored popcorn  Protein Foods Higher-fat cuts of meats (ribs, t-bone steak, regular hamburger) Bacon, sausage, or hot dogs Cold cuts, such as salami or bologna, deli meats, cured meats, corned beef Organ meats (liver, brains, gizzards, sweetbreads) Poultry with skin Fried or smoked meat, poultry, and fish Whole eggs and egg yolks (more than 2-4 per week) Salted legumes, nuts, seeds, or nut/seed butters Meat alternatives with high levels of sodium (>300 mg per serving) or saturated fat (>5 g per serving)  Dairy Whole  milk,?2% fat milk, buttermilk Whole milk yogurt or ice cream Cream Half-&-half Cream cheese Sour cream Cheese  Vegetables Canned or frozen vegetables with salt, fresh vegetables prepared with salt, butter, cheese, or cream sauce Fried vegetables Pickled vegetables such as olives, pickles, or sauerkraut  Fruits Fried fruits Fruits served with butter or cream  Oils Butter, stick margarine, shortening Partially hydrogenated oils or trans fats Tropical oils (coconut, palm, palm kernel oils)  Other Candy, sugar sweetened soft drinks and desserts Salt, sea salt, garlic salt, and seasoning mixes containing salt Bouillon cubes Ketchup, barbecue sauce, Worcestershire sauce, soy sauce, teriyaki sauce Miso Salsa Pickles, olives, relish   Heart Healthy Consistent Carbohydrate Vegetarian (Lacto-Ovo) Sample 1-Day Menu  Breakfast 1 cup oatmeal, cooked (2 carbohydrate servings)   cup blueberries (1 carbohydrate serving)  11 almonds, without salt  1 cup 1% milk (1 carbohydrate serving)  1 cup coffee  Morning Snack 1 cup fat-free plain yogurt (1 carbohydrate serving)  Lunch 1 whole wheat bun (1 carbohydrate servings)  1 black bean burger (1 carbohydrate servings)  1 slice cheddar cheese, low sodium  2 slices tomatoes  2 leaves lettuce  1 teaspoon mustard  1 small pear (1 carbohydrate servings)  1 cup green tea, unsweetened  Afternoon Snack 1/3 cup trail mix with nuts, seeds, and raisins, without salt (1 carbohydrate servinga)  Evening Meal  cup meatless chicken  2/3 cup brown rice, cooked (2 carbohydrate servings)  1 cup broccoli, cooked (2/3 carbohydrate serving)   cup carrots, cooked (1/3 carbohydrate serving)  2 teaspoons olive oil  1 teaspoon balsamic vinegar  1 whole wheat dinner roll (1 carbohydrate serving)  1 teaspoon margarine, soft, tub  1 cup 1% milk (1 carbohydrate serving)  Evening Snack 1 extra small banana (1 carbohydrate serving)  1 tablespoon peanut butter    Heart Healthy Consistent Carbohydrate Vegan Sample 1-Day Menu  Breakfast 1 cup oatmeal, cooked (2 carbohydrate servings)   cup blueberries (1 carbohydrate serving)  11 almonds, without salt  1 cup soymilk fortified with calcium, vitamin B12, and vitamin D  1 cup coffee  Morning Snack 6 ounces soy yogurt (1 carbohydrate servings)  Lunch 1 whole wheat bun(1 carbohydrate servings)  1 black bean burger (1 carbohydrate serving)  2 slices tomatoes  2 leaves lettuce  1 teaspoon mustard  1 small pear (1 carbohydrate servings)  1 cup green tea, unsweetened  Afternoon Snack 1/3 cup trail mix with nuts, seeds, and raisins, without salt (1 carbohydrate servings)  Evening Meal  cup meatless chicken  2/3 cup brown rice, cooked (2 carbohydrate servings)  1 cup broccoli, cooked (2/3 carbohydrate serving)   cup carrots, cooked (1/3 carbohydrate serving)  2 teaspoons olive oil  1  teaspoon balsamic vinegar  1 whole wheat dinner roll (1 carbohydrate serving)  1 teaspoon margarine, soft, tub  1 cup soymilk fortified with calcium, vitamin B12, and vitamin D  Evening Snack 1 extra small banana (1 carbohydrate serving)  1 tablespoon peanut butter    Heart Healthy Consistent Carbohydrate Sample 1-Day Menu  Breakfast 1 cup cooked oatmeal (2 carbohydrate servings)  3/4 cup blueberries (1 carbohydrate serving)  1 ounce almonds  1 cup skim milk (1 carbohydrate serving)  1 cup coffee  Morning Snack 1 cup sugar-free nonfat yogurt (1 carbohydrate serving)  Lunch 2 slices whole-wheat bread (2 carbohydrate servings)  2 ounces lean [place] breast  1 ounce low-fat Swiss cheese  1 teaspoon mustard  1 slice tomato  1 lettuce leaf  1 small pear (1 carbohydrate serving)  1 cup skim milk (1 carbohydrate serving)  Afternoon Snack 1 ounce trail mix with unsalted nuts, seeds, and raisins (1 carbohydrate serving)  Evening Meal 3 ounces salmon  2/3 cup cooked brown rice (2 carbohydrate servings)  1 teaspoon  soft margarine  1 cup cooked broccoli with 1/2 cup cooked carrots (1 carbohydrate serving  Carrots, cooked, boiled, drained, without salt  1 cup lettuce  1 teaspoon olive oil with vinegar for dressing  1 small whole grain roll (1 carbohydrate serving)  1 teaspoon soft margarine  1 cup unsweetened tea  Evening Snack 1 extra-small banana (1 carbohydrate serving)  Copyright 2020  [employer]. All rights reserved. Lars Masson RDN, LDN [email or web address] [email or web address] Telephone [phone]

## 2019-06-09 NOTE — Plan of Care (Signed)
  Problem: Tissue Perfusion: Goal: Adequacy of tissue perfusion will improve Outcome: Progressing   

## 2019-06-09 NOTE — Progress Notes (Signed)
New order to discharge patient home.  Cardiac monitoring d/c'd.  PIVs d/c'd.  AVS reviewed with patient and wife.  Living well with diabetes book reviewed with patient and wife.  Insulin starter kit reviewed with patient and wife.  Wife properly demonstrated insulin injection at lunch time after receiving teaching from RN.  Medication changes and scripts reviewed with patient and wife.  All materials given to wife to take home per patient preference. Patient transported to car via wheelchair.

## 2019-06-09 NOTE — Discharge Summary (Signed)
Physician Discharge Summary  Quill Grinder FFM:[id number] DOB: [birth year] DOA: 06/08/2019  PCP: Tonia Ghent, MD  Admit date: 06/08/2019 Discharge date: 06/09/2019  Admitted From: Home Disposition: Home  Recommendations for Outpatient Follow-up:  1. Follow up with new PCP scheduled by patient's wife for 06/18/19 2. Please obtain CBC/BMP/Mag at follow up 3. Please follow up on the following pending results: None  Home Health: None Equipment/Devices: None  Discharge Condition: Stable CODE STATUS: DNR/DNI    HPI: Per Dr. Evorn Gong Kaser is a 69 y.o. male with medical history significant of HTN presenting with weakness.  He reports that about 3 weeks ago, he went for COVID testing - he was having what he thought were symptoms.  He was negative and had a strep test that turned positive; he was treated with a Zpack and given something to treat thrush.  His BP was 184/114 and they wanted him to go to the ER.  They did not treat the BP.  He hasn't taken any BP medicine, trying to keep under control with vitamins and exercise (but exercise was shut down by COVID, hasn't been in a 2-3 years).  He has lost a lot of weight.  He seemed dehydrated over the past month.  He has lost about 40 pounds over maybe 2 months, worse in the last 2 weeks.  +polyuria, polydipsia.  He has craved a lot of orange juice but nothing seems to quench his thirst.  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  ED Course:  HHN - no h/o DM, presenting with glucose >800, anion gap 25.  Lost 40 pounds over 2 months.  Hospital Course: Patient was admitted for hyperosmolar hyperglycemic syndrome and elevated blood pressure.  Treated with insulin drip and IV fluid, and transitioned care subcu insulin the next day.  Started on amlodipine and losartan for hypertension.  Patient received diabetic education including insulin administration by diabetic coordinator and RN.  He also received dietary  counseling by dietitian.   He was discharged on Lantus, Metformin, Jardiance, amlodipine, losartan and atorvastatin.  He was provided with prescription for diabetic supplies including glucometer, test strips, lancets and pen needles.  Both patient and patient's wife felt comfortable with managing his diabetes at home after counseling and education.  His wife has already set up new PCP appointment in 2 weeks.   See individual pulmonary below for more hospital course.  Discharge Diagnoses:  Hyperosmolar hyperglycemic syndrome: CBG improved.  A1c 13.4%. -Discharge medication and plan as above. -Ambulatory referral to nutritionist for diabetic and nutritional teaching and counseling  Essential hypertension: BP elevated but improved. -Discharged on amlodipine 10 mg daily and losartan 25 mg daily.  Titrate as appropriate.  AKI: Likely prerenal in the setting of HHS.  Resolved.  Hypokalemia: Replenished prior to discharge.  Unintentional weight loss: Suspect this to be due to diabetes.  However, he needs age-appropriate screening including colonoscopy.  Counseled on the importance of this.  Discharge Instructions  Discharge Instructions    Call MD for:  difficulty breathing, headache or visual disturbances   Complete by: As directed    Call MD for:  extreme fatigue   Complete by: As directed    Call MD for:  persistant nausea and vomiting   Complete by: As directed    Diet - low sodium heart healthy   Complete by: As directed    Diet Carb Modified   Complete by: As directed  Discharge instructions   Complete by: As directed    It has been a pleasure taking care of you! You were hospitalized and treated for uncontrolled diabetes, blood pressure and kidney injury.  You were treated with medications and your blood glucose, blood pressure and kidney numbers improved to the point we think it is safe to let you go home and follow-up with your primary care doctor.  We are discharging you  on medications for your diabetes, blood pressure and cholesterol.  It is very important that you take your medications as prescribed. Please review your new medication list and the directions before you take your medications.  Please read the information packet we gave you about diabetes diet and exercise.   Take care,   Increase activity slowly   Complete by: As directed    Referral to Nutrition and Diabetes Services   Complete by: As directed    Choose type of Diabetes Self-Management Training (DSMT) training services and number of hours requested: Initial DSMT: 10 hours   Check all special needs that apply to patient requiring 1 on 1 DSMT: Does not apply   DSMT Content: Diabetes disease process   Choose the type of Medical Nutrition Therapy (MNT) and number of hours: Initial MNT: 3 hours   FOR MEDICARE PATIENTS: I hereby certify that I am managing this beneficiary's diabetes condition and that the above prescribed training is a necessary part of management.: Yes     Allergies as of 06/09/2019      Reactions   Sulfa Antibiotics Hives      Medication List    STOP taking these medications   ferrous sulfate 325 (65 FE) MG tablet   Garlic 7564 MG Caps   ibuprofen 200 MG tablet Commonly known as: ADVIL   niacin 250 MG tablet   omeprazole 20 MG capsule Commonly known as: PRILOSEC   vitamin B-12 1000 MCG tablet Commonly known as: CYANOCOBALAMIN   vitamin E 180 MG (400 UNITS) capsule Generic drug: vitamin E     TAKE these medications   amLODipine 10 MG tablet Commonly known as: NORVASC Take 1 tablet (10 mg total) by mouth daily. Start taking on: June 10, 2019   atorvastatin 40 MG tablet Commonly known as: LIPITOR Take 1 tablet (40 mg total) by mouth daily at 6 PM.   blood glucose meter kit and supplies Kit Dispense based on patient and insurance preference. Use up to four times daily as directed. (FOR ICD-9 250.00, 250.01).   Insulin Glargine 100 UNIT/ML Solostar  Pen Commonly known as: LANTUS Inject 15 Units into the skin daily.   Jardiance 10 MG Tabs tablet Generic drug: empagliflozin Take 10 mg by mouth daily before breakfast.   losartan 25 MG tablet Commonly known as: COZAAR Take 1 tablet (25 mg total) by mouth daily. Start taking on: June 10, 2019   metFORMIN 500 MG tablet Commonly known as: Glucophage Take 1 tablet (500 mg total) by mouth 2 (two) times daily with a meal.   Pen Needles 5/16" 30G X 8 MM Misc 1 pen by Does not apply route 2 (two) times daily.       Consultations:  Diabetic coordinator  Procedures/Studies:  None   DG Chest Port 1 View  Result Date: 06/08/2019 CLINICAL DATA:  Weakness. EXAM: PORTABLE CHEST 1 VIEW COMPARISON:  August 17, 2015. FINDINGS: The heart size and mediastinal contours are within normal limits. Both lungs are clear. The visualized skeletal structures are unremarkable. IMPRESSION: No active disease.  Electronically Signed   By: Marijo Conception M.D.   On: 06/08/2019 10:54       Discharge Exam: Vitals:   06/09/19 0825 06/09/19 1156  BP:  (!) 164/108  Pulse: 79 81  Resp:    Temp: 98.2 F (36.8 C) 98 F (36.7 C)  SpO2:      GENERAL: No acute distress.  Appears well.  HEENT: MMM.  Vision and hearing grossly intact.  NECK: Supple.  No apparent JVD.  RESP:  No IWOB. Good air movement bilaterally. CVS:  RRR. Heart sounds normal.  ABD/GI/GU: Bowel sounds present. Soft. Non tender.  MSK/EXT:  Moves extremities. No apparent deformity or edema.  SKIN: no apparent skin lesion or wound NEURO: Awake, alert and oriented appropriately.  No apparent focal neuro deficit. PSYCH: Calm. Normal affect.   The results of significant diagnostics from this hospitalization (including imaging, microbiology, ancillary and laboratory) are listed below for reference.     Microbiology: Recent Results (from the past 240 hour(s))  Respiratory Panel by RT PCR (Flu A&B, Covid) - Nasopharyngeal Swab      Status: None   Collection Time: 06/08/19 11:49 AM   Specimen: Nasopharyngeal Swab  Result Value Ref Range Status   SARS Coronavirus 2 by RT PCR NEGATIVE NEGATIVE Final    Comment: (NOTE) SARS-CoV-2 target nucleic acids are NOT DETECTED. The SARS-CoV-2 RNA is generally detectable in upper respiratoy specimens during the acute phase of infection. The lowest concentration of SARS-CoV-2 viral copies this assay can detect is 131 copies/mL. A negative result does not preclude SARS-Cov-2 infection and should not be used as the sole basis for treatment or other patient management decisions. A negative result may occur with  improper specimen collection/handling, submission of specimen other than nasopharyngeal swab, presence of viral mutation(s) within the areas targeted by this assay, and inadequate number of viral copies (<131 copies/mL). A negative result must be combined with clinical observations, patient history, and epidemiological information. The expected result is Negative. Fact Sheet for Patients:  PinkCheek.be Fact Sheet for Healthcare Providers:  GravelBags.it This test is not yet ap proved or cleared by the [place] FDA and  has been authorized for detection and/or diagnosis of SARS-CoV-2 by FDA under an Emergency Use Authorization (EUA). This EUA will remain  in effect (meaning this test can be used) for the duration of the COVID-19 declaration under Section 564(b)(1) of the Act, 21 U.S.C. section 360bbb-3(b)(1), unless the authorization is terminated or revoked sooner.    Influenza A by PCR NEGATIVE NEGATIVE Final   Influenza B by PCR NEGATIVE NEGATIVE Final    Comment: (NOTE) The Xpert Xpress SARS-CoV-2/FLU/RSV assay is intended as an aid in  the diagnosis of influenza from Nasopharyngeal swab specimens and  should not be used as a sole basis for treatment. Nasal washings and  aspirates are unacceptable for  Xpert Xpress SARS-CoV-2/FLU/RSV  testing. Fact Sheet for Patients: PinkCheek.be Fact Sheet for Healthcare Providers: GravelBags.it This test is not yet approved or cleared by the [place] FDA and  has been authorized for detection and/or diagnosis of SARS-CoV-2 by  FDA under an Emergency Use Authorization (EUA). This EUA will remain  in effect (meaning this test can be used) for the duration of the  Covid-19 declaration under Section 564(b)(1) of the Act, 21  U.S.C. section 360bbb-3(b)(1), unless the authorization is  terminated or revoked. Performed at [facility], [place] [address] [place], [place] [address]   MRSA PCR Screening  Status: None   Collection Time: 06/09/19  3:17 AM   Specimen: Nasopharyngeal  Result Value Ref Range Status   MRSA by PCR NEGATIVE NEGATIVE Final    Comment:        The GeneXpert MRSA Assay (FDA approved for NASAL specimens only), is one component of a comprehensive MRSA colonization surveillance program. It is not intended to diagnose MRSA infection nor to guide or monitor treatment for MRSA infections. Performed at [facility], [place] [address] [place], [place] [address]      Labs: BNP (last 3 results) No results for input(s): BNP in the last 8760 hours. Basic Metabolic Panel: Recent Labs  Lab 06/08/19 0958 06/08/19 1056 06/08/19 1438 06/08/19 1815 06/09/19 0058 06/09/19 0419 06/09/19 0726  NA   < >  --  133* 131* 136 132* 131*  K   < >  --  4.1 3.4* 3.2* 3.2* 3.0*  CL   < >  --  101 103 107 104 103  CO2   < >  --  20* 21* 20* 20* 21*  GLUCOSE   < >  --  383* 405* 176* 207* 238*  BUN   < >  --  '18 15 15 13 11  '$ CREATININE   < >  --  1.20 0.98 0.97 0.91 0.86  CALCIUM   < >  --  9.3 8.1* 8.5* 8.2* 8.1*  MG  --  2.2  --   --   --   --   --    < > = values in this interval not displayed.   Liver Function Tests: No results for input(s): AST, ALT, ALKPHOS,  BILITOT, PROT, ALBUMIN in the last 168 hours. No results for input(s): LIPASE, AMYLASE in the last 168 hours. No results for input(s): AMMONIA in the last 168 hours. CBC: Recent Labs  Lab 06/08/19 0958  WBC 7.8  HGB 16.6  HCT 46.9  MCV 88.3  PLT 250   Cardiac Enzymes: No results for input(s): CKTOTAL, CKMB, CKMBINDEX, TROPONINI in the last 168 hours. BNP: Invalid input(s): POCBNP CBG: Recent Labs  Lab 06/09/19 0640 06/09/19 0824 06/09/19 0901 06/09/19 1005 06/09/19 1154  GLUCAP 228* 207* 227* 292* 228*   D-Dimer No results for input(s): DDIMER in the last 72 hours. Hgb A1c Recent Labs    06/08/19 1057  HGBA1C 13.4*   Lipid Profile No results for input(s): CHOL, HDL, LDLCALC, TRIG, CHOLHDL, LDLDIRECT in the last 72 hours. Thyroid function studies Recent Labs    06/08/19 1056  TSH 0.890   Anemia work up No results for input(s): VITAMINB12, FOLATE, FERRITIN, TIBC, IRON, RETICCTPCT in the last 72 hours. Urinalysis    Component Value Date/Time   COLORURINE YELLOW 06/08/2019 0958   APPEARANCEUR HAZY (A) 06/08/2019 0958   LABSPEC 1.027 06/08/2019 0958   PHURINE 5.0 06/08/2019 0958   GLUCOSEU >=500 (A) 06/08/2019 0958   HGBUR NEGATIVE 06/08/2019 0958   BILIRUBINUR NEGATIVE 06/08/2019 0958   KETONESUR NEGATIVE 06/08/2019 0958   PROTEINUR NEGATIVE 06/08/2019 0958   UROBILINOGEN 0.2 05/26/2007 1551   NITRITE NEGATIVE 06/08/2019 0958   LEUKOCYTESUR NEGATIVE 06/08/2019 0958   Sepsis Labs Invalid input(s): PROCALCITONIN,  WBC,  LACTICIDVEN   Time coordinating discharge: 35 minutes  SIGNED:  Mercy Riding, MD  [facility] 06/09/2019, 12:29 PM  If 7PM-7AM, please contact night-coverage [email or web address] Password [email or web address]

## 2019-06-09 NOTE — Progress Notes (Signed)
Inpatient Diabetes Program Recommendations  AACE/ADA: New Consensus Statement on Inpatient Glycemic Control (2015)  Target Ranges:  Prepandial:   less than 140 mg/dL      Peak postprandial:   less than 180 mg/dL (1-2 hours)      Critically ill patients:  140 - 180 mg/dL   Lab Results  Component Value Date   GLUCAP 292 (H) 06/09/2019   HGBA1C 13.4 (H) 06/08/2019    Review of Glycemic Control  Diabetes history: type 2 Outpatient Diabetes medications: none Current orders for Inpatient glycemic control: IV insulin   Inpatient Diabetes Program Recommendations:   Noted transition insulin orders written: Levemir 12 units daily, Novolog MODERATE TID & HS, Novolog 4 U TID. Agree with orders.  Noted that patient's HgbA1C is 13.4%.  Will try to talk with patient today. Will order Living Well with Diabetes booklet. Staff RNs can work with patient on giving insulin in case he is discharged on insulin.  Will continue to monitor blood sugars while in the hospital.  Smith Mince RN BSN CDE Diabetes Coordinator Pager: [phone]  8am-5pm

## 2019-06-09 NOTE — Progress Notes (Addendum)
Spoke with patient and wife on the phone about his new onset of diabetes. Had been experiencing polydipsia and polyuria for about 2 weeks.  Had lost up to 40 pounds over this time. HgbA1C is 13.4%. We discussed blood sugars over 2-3 month period, normal blood sugars, other questions that he had to ask me. He has been drinking lots of sweet drinks at home. Discussed getting a home blood glucose meter, strips, and lancets. Suggested [employer] Rel[employer] meter, strips, and lancets.   Discussed using insulin at home. Patient would like to use insulin pens. Staff RNs will have patient give own injections with syringe. Suggested watching Youtube video on how to use insulin pens. Will need insulin pen prescription for physician's choice of insulin and insulin pen needles. Physician can give patient a general prescription for home blood glucose meter, strips, and lancets (#[id number])  Would recommend ordering outpatient diabetes education. [facility] for [facility] instruction and education. Patient's wife states that he has an appointment with a new PCP on February 2.   Ordered Living Well with Diabetes booklet from pharmacy. Will attach diabetes Exit notes for discharge. Consult for dietician ordered. Also ordered insulin pen starter kit that includes a small package of insulin pen needles.   Harvel Ricks RN BSN CDE Diabetes Coordinator Pager: [phone]  8am-5pm

## 2019-06-09 NOTE — Progress Notes (Signed)
On call MD Bruna Potter) contacted for bp of 173/98, with a map of 120. Awaiting orders

## 2019-06-09 NOTE — Progress Notes (Signed)
Initial Nutrition Assessment  DOCUMENTATION CODES:   Not applicable  INTERVENTION:  -Glucerna Shake po TID, each supplement provides 220 kcal and 10 grams of protein  -MVI with minerals daily  -Education provided in discharge instructions   NUTRITION DIAGNOSIS:   Food and nutrition related knowledge deficit related to chronic illness(T2DM) as evidenced by other (comment)(A1c 13.4 on admission).    GOAL:   Other (Comment)(Patinet will adhere to nutrition recommendations improving A1c)    MONITOR:   PO intake, Weight trends, Supplement acceptance, Labs, I & O's  REASON FOR ASSESSMENT:   Malnutrition Screening Tool, Consult Assessment of nutrition requirement/status  ASSESSMENT:  RD working remotely.  69 year old male with past medical history of HTN, GERD,and tinnitus who presented with weakness, polyuria, polydipsia, and unintentional 40 lb weight loss over the past 2 months. In ED glucose >800 and admitted for hyperosmolar hyperglycemic state.  Per notes, patient without known prior DM, appears to be lack of physician visits; glucose 174 in 2009. A1c 13.4 on admission. Patient recently seen for possible COVID (negative), diagnosed/treated for strep and thrush, noted improving with a few patches on tongue.   Patient NPO on admit, advanced to HH/CM this morning. No documented meals for review at this time. RD attempted to reach pt via phone this morning, pt did not pick up. Will continue to monitor for po intake and provide Glucerna shake to aid with calorie and protein needs. "Heart Healthy Consistent Carbohydrate" education handout has been attached to pt discharge instructions along with RD contact information. Patient is encouraged to contact RD with questions and will provide education at that time.   Current wt 81.6 kg (179.52 lbs) Per notes, pt reports 40 lb unintentional wt loss over the past 2 months. No recent wt history available for review, last weight prior to  admission 92.5 kg in 2015.  Reported weight loss is concerning for malnutrition, unable to identify at this time. NFPE to be completed at follow up to assess for fat/muscle depletions. Nutrition supplements will be provided.  Medications reviewed and include: SS novolg, Levemir, Protonix Drips: D5 @ 125 ml/hr provides 510 kcal Myxredlin 100 units/100 ml  Labs: CBGs 292,227,207,228, Na 131 (L), K 3 (L)  NUTRITION - FOCUSED PHYSICAL EXAM: Unable to complete at this time, RD working remotely.  Diet Order:   Diet Order            Diet heart healthy/carb modified Room service appropriate? Yes; Fluid consistency: Thin  Diet effective now              EDUCATION NEEDS:   Education needs have been addressed  Skin:  Skin Assessment: Reviewed RN Assessment  Last BM:  1/23 (type 5)  Height:   Ht Readings from Last 1 Encounters:  06/08/19 5\' 10"  (1.778 m)    Weight:   Wt Readings from Last 1 Encounters:  06/08/19 81.6 kg    Ideal Body Weight:  75.5 kg  BMI:  Body mass index is 25.83 kg/m.  Estimated Nutritional Needs:   Kcal:  2000-2200  Protein:  100-110  Fluid:  >/= 2 L/day   06/10/19, RD, LDN Clinical Nutrition [facility] Telephone [phone] After Hours/Weekend Pager: [phone]

## 2019-06-18 ENCOUNTER — Telehealth: Payer: Self-pay

## 2019-06-18 NOTE — Telephone Encounter (Signed)

## 2019-06-19 ENCOUNTER — Other Ambulatory Visit: Payer: Self-pay

## 2019-06-19 ENCOUNTER — Ambulatory Visit (INDEPENDENT_AMBULATORY_CARE_PROVIDER_SITE_OTHER): Payer: Medicare Other | Admitting: Internal Medicine

## 2019-06-19 ENCOUNTER — Encounter: Payer: Self-pay | Admitting: Internal Medicine

## 2019-06-19 VITALS — BP 160/84 | HR 71 | Temp 97.3°F | Resp 17 | Ht 69.5 in | Wt 188.2 lb

## 2019-06-19 DIAGNOSIS — Z794 Long term (current) use of insulin: Secondary | ICD-10-CM | POA: Insufficient documentation

## 2019-06-19 DIAGNOSIS — Z1159 Encounter for screening for other viral diseases: Secondary | ICD-10-CM

## 2019-06-19 DIAGNOSIS — Z23 Encounter for immunization: Secondary | ICD-10-CM

## 2019-06-19 DIAGNOSIS — I10 Essential (primary) hypertension: Secondary | ICD-10-CM | POA: Diagnosis not present

## 2019-06-19 DIAGNOSIS — E119 Type 2 diabetes mellitus without complications: Secondary | ICD-10-CM

## 2019-06-19 DIAGNOSIS — Z7689 Persons encountering health services in other specified circumstances: Secondary | ICD-10-CM

## 2019-06-19 DIAGNOSIS — Z13228 Encounter for screening for other metabolic disorders: Secondary | ICD-10-CM | POA: Diagnosis not present

## 2019-06-19 DIAGNOSIS — G4700 Insomnia, unspecified: Secondary | ICD-10-CM

## 2019-06-19 DIAGNOSIS — Z1211 Encounter for screening for malignant neoplasm of colon: Secondary | ICD-10-CM

## 2019-06-19 DIAGNOSIS — G63 Polyneuropathy in diseases classified elsewhere: Secondary | ICD-10-CM

## 2019-06-19 LAB — GLUCOSE, POCT (MANUAL RESULT ENTRY): POC Glucose: 103 mg/dl — AB (ref 70–99)

## 2019-06-19 MED ORDER — TRAZODONE HCL 50 MG PO TABS: 25.0000 mg | ORAL_TABLET | Freq: Every evening | ORAL | 3 refills | Status: DC | PRN

## 2019-06-19 MED ORDER — LOSARTAN POTASSIUM 50 MG PO TABS: 50.0000 mg | ORAL_TABLET | Freq: Every day | ORAL | 3 refills | Status: DC

## 2019-06-19 NOTE — Patient Instructions (Signed)
I have increased your Losartan to 50 mg daily. You can take two of the tablets you already have at one time until you run out of those so that they are not wasted.   Please take your blood pressure at home in the morning prior to drinking any caffeine. Sit with both feet flat on the floor. Rest for at least 2 minutes prior to checking your blood pressure to allow it to normalize after any movement. Write this number down on your log and please bring your log to every office visit. Call the clinic if you have numbers consistently greater than 150 on the top or 100 on the bottom.

## 2019-06-19 NOTE — Progress Notes (Signed)
Subjective:    [name] - 69 y.o. male MRN [id number]  Date of birth: 1950-10-20  HPI  [name] is to establish care. Patient has a PMH significant for HTN. He presented to ED on 1/22 and was found to have glucose >800, anion gap 25. Lost 40 pounds over 2 months. He was admitted for hyperosmolar hyperglycemic syndrome. He was treated with insulin drip and IVFs and transitioned to subcutaneous insulin. Found to have A1c 13.4%.   He was discharged on Lantus, Metformin, Jardiance, amlodipine, losartan and atorvastatin.  He was provided with prescription for diabetic supplies including glucometer, test strips, lancets and pen needles.   Taking Lantus 15u each AM. Measuring fasting CBG daily. Typically ranges between 115-140. Has occasional number in 80s.   Monitors BP at home. Was 129/80 this AM. Typically ranges from 1202-140s/80-90s.     ROS per HPI    Health Maintenance Due  Topic Date Due  . Hepatitis C Screening  16-May-1951  . OPHTHALMOLOGY EXAM  07/16/1962  . COLONOSCOPY  07/16/2002     Past Medical History: Patient Active Problem List   Diagnosis Date Noted  . Polyneuropathy associated with underlying disease (HCC) 06/19/2019  . Type 2 diabetes mellitus without complication, with long-term current use of insulin (HCC) 06/19/2019  . Insomnia 06/19/2019  . Hyperosmolar hyperglycemic state (HHS) (HCC) 06/08/2019  . Essential hypertension 06/08/2019  . Thrush 06/08/2019  . Tremor 08/29/2013  . Cough 08/29/2013      Social History   reports that he has never smoked. He has never used smokeless tobacco. He reports current alcohol use. He reports that he does not use drugs.   Family History  family history includes Dementia in his mother; Hypertension in his father and mother; Parkinson's disease in his father.   Medications: reviewed and updated   Objective:   Physical Exam BP (!) 160/84   Pulse 71   Temp (!) 97.3 F (36.3 C) (Temporal)   Resp 17   Ht 5'  9.5" (1.765 m)   Wt 188 lb 3.2 oz (85.4 kg)   SpO2 98%   BMI 27.39 kg/m  Physical Exam  Constitutional: He is oriented to person, place, and time and well-developed, well-nourished, and in no distress. No distress.  HENT:  Head: Normocephalic and atraumatic.  Eyes: Conjunctivae and EOM are normal.  Cardiovascular: Normal rate, regular rhythm and normal heart sounds.  No murmur heard. Pulmonary/Chest: Effort normal and breath sounds normal. No respiratory distress.  Musculoskeletal:        General: Normal range of motion.     Comments: Diabetic Foot Check -  Appearance - no lesions, ulcers or calluses Skin - no unusual pallor or redness Monofilament testing - diminished on right ball and heel of foot, normal on left  Right - Great toe, medial, central, lateral ball and posterior foot intact Left - Great toe, medial, central, lateral ball and posterior foot intact   Neurological: He is alert and oriented to person, place, and time.  Skin: Skin is warm and dry. He is not diaphoretic.  Psychiatric: Affect and judgment normal.        Assessment & Plan:    1. Encounter to establish care   2. Type 2 diabetes mellitus without complication, with long-term current use of insulin (HCC) Last A1c 13.4 (Jan 2021) with new diagnosis of DM. CBG fasting is 103 today. Fasting CBGs sound overall well controlled. Continue current medication regimen.  Counseled on Diabetic diet, my plate method, 086  minutes of moderate intensity exercise/week Blood sugar logs with fasting goals of 80-120 mg/dl, random of less than 180 and in the event of sugars less than 60 mg/dl or greater than 400 mg/dl encouraged to notify the clinic. Advised on the need for annual eye exams, annual foot exams, Pneumonia vaccine. - Microalbumin/Creatinine Ratio, Urine - Glucose (CBG) - Ambulatory referral to Ophthalmology  3. Essential hypertension Not at goal today and sounds like has some room for improvement with home  ranges. Increase Losartan to 50 mg daily. Continue Amlodipine.  - Comprehensive metabolic panel - losartan (COZAAR) 50 MG tablet; Take 1 tablet (50 mg total) by mouth daily.  Dispense: 90 tablet; Refill: 3  4. Screening for metabolic disorder Will calculate ASCVD risk. Patient on Lipitor 40 mg, may benefit from higher dose. Patient also inquired about ASA. Will advise once have results.  - Lipid Panel  5. Need for hepatitis C screening test - Hepatitis C antibody  6. Screening for colon cancer - Ambulatory referral to Gastroenterology  7. Insomnia, unspecified type Has tried Melatonin without relief.  - traZODone (DESYREL) 50 MG tablet; Take 0.5-1 tablets (25-50 mg total) by mouth at bedtime as needed for sleep.  Dispense: 30 tablet; Refill: 3  8. Polyneuropathy associated with underlying disease ([place]) Endorses neuropathy sensation and DM foot exam reveals some evidence of neuropathy. Discussed Gabapentin, patient declined for now.   9. Need for pneumococcal vaccine - Pneumococcal polysaccharide vaccine 23-valent greater than or equal to 2yo subcutaneous/IM   [name], D.O. 06/19/2019, 11:35 AM Primary Care at [facility]

## 2019-06-20 ENCOUNTER — Encounter: Payer: Self-pay | Admitting: Gastroenterology

## 2019-06-20 ENCOUNTER — Other Ambulatory Visit: Payer: Self-pay | Admitting: Internal Medicine

## 2019-06-20 DIAGNOSIS — Z9189 Other specified personal risk factors, not elsewhere classified: Secondary | ICD-10-CM | POA: Insufficient documentation

## 2019-06-20 LAB — COMPREHENSIVE METABOLIC PANEL
ALT: 48 IU/L — ABNORMAL HIGH (ref 0–44)
AST: 33 IU/L (ref 0–40)
Albumin/Globulin Ratio: 1.9 (ref 1.2–2.2)
Albumin: 4.7 g/dL (ref 3.8–4.8)
Alkaline Phosphatase: 86 IU/L (ref 39–117)
BUN/Creatinine Ratio: 22 (ref 10–24)
BUN: 24 mg/dL (ref 8–27)
Bilirubin Total: 0.6 mg/dL (ref 0.0–1.2)
CO2: 21 mmol/L (ref 20–29)
Calcium: 9.5 mg/dL (ref 8.6–10.2)
Chloride: 101 mmol/L (ref 96–106)
Creatinine, Ser: 1.11 mg/dL (ref 0.76–1.27)
GFR calc Af Amer: 80 mL/min/{1.73_m2} (ref >59)
GFR calc non Af Amer: 69 mL/min/{1.73_m2} (ref >59)
Globulin, Total: 2.5 g/dL (ref 1.5–4.5)
Glucose: 99 mg/dL (ref 65–99)
Potassium: 4.3 mmol/L (ref 3.5–5.2)
Sodium: 139 mmol/L (ref 134–144)
Total Protein: 7.2 g/dL (ref 6.0–8.5)

## 2019-06-20 LAB — LIPID PANEL
Chol/HDL Ratio: 3.4 ratio (ref 0.0–5.0)
Cholesterol, Total: 144 mg/dL (ref 100–199)
HDL: 42 mg/dL (ref >39)
LDL Chol Calc (NIH): 86 mg/dL (ref 0–99)
Triglycerides: 83 mg/dL (ref 0–149)
VLDL Cholesterol Cal: 16 mg/dL (ref 5–40)

## 2019-06-20 LAB — HEPATITIS C ANTIBODY: Hep C Virus Ab: <0.1 s/co ratio (ref 0.0–0.9)

## 2019-06-20 LAB — MICROALBUMIN / CREATININE URINE RATIO
Creatinine, Urine: 56.2 mg/dL
Microalb/Creat Ratio: 11 mg/g creat (ref 0–29)
Microalbumin, Urine: 6.2 ug/mL

## 2019-06-20 MED ORDER — ATORVASTATIN CALCIUM 80 MG PO TABS: 80.0000 mg | ORAL_TABLET | Freq: Every day | ORAL | 3 refills | Status: DC

## 2019-06-26 DIAGNOSIS — G44219 Episodic tension-type headache, not intractable: Secondary | ICD-10-CM | POA: Diagnosis not present

## 2019-06-28 ENCOUNTER — Encounter (INDEPENDENT_AMBULATORY_CARE_PROVIDER_SITE_OTHER): Payer: Medicare Other | Admitting: Ophthalmology

## 2019-06-28 DIAGNOSIS — E11319 Type 2 diabetes mellitus with unspecified diabetic retinopathy without macular edema: Secondary | ICD-10-CM

## 2019-06-28 DIAGNOSIS — H43813 Vitreous degeneration, bilateral: Secondary | ICD-10-CM

## 2019-06-28 DIAGNOSIS — I10 Essential (primary) hypertension: Secondary | ICD-10-CM

## 2019-06-28 DIAGNOSIS — H35033 Hypertensive retinopathy, bilateral: Secondary | ICD-10-CM | POA: Diagnosis not present

## 2019-06-28 DIAGNOSIS — H35372 Puckering of macula, left eye: Secondary | ICD-10-CM | POA: Diagnosis not present

## 2019-06-28 DIAGNOSIS — E113293 Type 2 diabetes mellitus with mild nonproliferative diabetic retinopathy without macular edema, bilateral: Secondary | ICD-10-CM

## 2019-06-28 LAB — HM DIABETES EYE EXAM

## 2019-06-29 ENCOUNTER — Telehealth: Payer: Self-pay | Admitting: Internal Medicine

## 2019-06-29 MED ORDER — ONETOUCH DELICA LANCETS 33G MISC: 2 refills | Status: DC

## 2019-06-29 MED ORDER — ONETOUCH VERIO VI STRP: ORAL_STRIP | 2 refills | Status: DC

## 2019-06-29 NOTE — Telephone Encounter (Signed)
1) Medication(s) Requested (by name): OneTouch Delica Lancets 33G MISC [[id number]]  ONETOUCH VERIO test strip [[id number]]   2) Pharmacy of Choice:[facility] - [place], [place] - [address]  [address]   3) Special Requests:   Approved medications will be sent to the pharmacy, we will reach out if there is an issue.  Requests made after 3pm may not be addressed until the following business day!  If a patient is unsure of the name of the medication(s) please note and ask patient to call back when they are able to provide all info, do not send to responsible party until all information is available!

## 2019-06-29 NOTE — Telephone Encounter (Signed)
Rxs sent

## 2019-07-16 DIAGNOSIS — H25013 Cortical age-related cataract, bilateral: Secondary | ICD-10-CM | POA: Diagnosis not present

## 2019-07-16 DIAGNOSIS — H2513 Age-related nuclear cataract, bilateral: Secondary | ICD-10-CM | POA: Diagnosis not present

## 2019-07-16 DIAGNOSIS — H35033 Hypertensive retinopathy, bilateral: Secondary | ICD-10-CM | POA: Diagnosis not present

## 2019-07-16 DIAGNOSIS — E113293 Type 2 diabetes mellitus with mild nonproliferative diabetic retinopathy without macular edema, bilateral: Secondary | ICD-10-CM | POA: Diagnosis not present

## 2019-07-18 ENCOUNTER — Telehealth: Payer: Self-pay

## 2019-07-18 NOTE — Telephone Encounter (Signed)
Called patient to do their pre-visit COVID screening.  Call went to voicemail. Unable to do prescreening.  

## 2019-07-19 ENCOUNTER — Other Ambulatory Visit: Payer: Self-pay

## 2019-07-19 ENCOUNTER — Encounter: Payer: Self-pay | Admitting: Internal Medicine

## 2019-07-19 ENCOUNTER — Ambulatory Visit (INDEPENDENT_AMBULATORY_CARE_PROVIDER_SITE_OTHER): Payer: Medicare Other | Admitting: Internal Medicine

## 2019-07-19 VITALS — BP 151/84 | HR 69 | Temp 97.5°F | Resp 17 | Ht 69.5 in | Wt 183.0 lb

## 2019-07-19 DIAGNOSIS — I10 Essential (primary) hypertension: Secondary | ICD-10-CM

## 2019-07-19 DIAGNOSIS — Z794 Long term (current) use of insulin: Secondary | ICD-10-CM

## 2019-07-19 DIAGNOSIS — E119 Type 2 diabetes mellitus without complications: Secondary | ICD-10-CM | POA: Diagnosis not present

## 2019-07-19 NOTE — Progress Notes (Signed)
Subjective:    [name] - 69 y.o. male MRN [id number]  Date of birth: 1950/10/11  HPI  [name] is here for HTN f/u.  Chronic HTN Disease Monitoring:  Home BP Monitoring - Getting 120s/80s mostly at home. Highest systolic at home of late was 134 or 135.  Chest pain- no  Dyspnea- no Headache - no  Medications: Losartan 50 mg (increased from 25 mg at office visit 2/2), Amlodipine 10 mg  Compliance- yes Lightheadedness- no  Edema- no   Diabetes mellitus, Type 2 Disease Monitoring             Blood Sugar Ranges: Fasting - <120;  Reports CBGs were good until last night, thinks the culprit was pineapple. He ate 5 chunks before testing and it was 191.              Polyuria: no             Visual problems: yes, has cataract surgery scheduled    Urine Microalbumin WNL   Last A1C: 13.4 (06/08/19)   Medications: Metformin 500 mg BID, Lantus 15u  Medication Compliance: yes  Medication Side Effects             Hypoglycemia: no   Preventitive Health Care             Eye Exam: UTD (records pending)              Foot Exam: UTD     Health Maintenance Due  Topic Date Due  . OPHTHALMOLOGY EXAM  07/16/1962  . COLONOSCOPY  07/16/2002    -  reports that he has never smoked. He has never used smokeless tobacco. - Review of Systems: Per HPI. - Past Medical History: Patient Active Problem List   Diagnosis Date Noted  . Candidate for statin therapy due to risk of future cardiovascular event 06/20/2019  . Polyneuropathy associated with underlying disease ([place]) 06/19/2019  . Type 2 diabetes mellitus without complication, with long-term current use of insulin ([place]) 06/19/2019  . Insomnia 06/19/2019  . Hyperosmolar hyperglycemic state (HHS) ([place]) 06/08/2019  . Essential hypertension 06/08/2019  . Tremor 08/29/2013   - Medications: reviewed and updated   Objective:   Physical Exam BP (!) 151/84   Pulse 69   Temp (!) 97.5 F (36.4 C) (Temporal)   Resp 17   Ht  5' 9.5" (1.765 m)   Wt 183 lb (83 kg)   SpO2 97%   BMI 26.64 kg/m  Physical Exam  Constitutional: He is oriented to person, place, and time and well-developed, well-nourished, and in no distress. No distress.  Cardiovascular: Normal rate.  Pulmonary/Chest: Effort normal. No respiratory distress.  Musculoskeletal:        General: Normal range of motion.  Neurological: He is alert and oriented to person, place, and time.  Skin: Skin is warm and dry. He is not diaphoretic.  Psychiatric: Affect and judgment normal.           Assessment & Plan:   1. Essential hypertension BP not at goal today, but has been at goal at home. Patient reports he was very anxious about his appointment, suspect that is contributing to elevated reading. Will have him return in one month with his own BP cuff for comparison.  Counseled on blood pressure goal of less than 130/80, low-sodium, DASH diet, medication compliance, 150 minutes of moderate intensity exercise per week. Discussed medication compliance, adverse effects. - Basic metabolic panel  2. Type 2 diabetes mellitus  without complication, with long-term current use of insulin (HCC) Fasting CBGs all essentially at goal. Can just check in AM now. Continue current regimen for now. Reviewed hypoglycemia protocol. Return in one month for A1c. Goal will be to decrease insulin dose.  Counseled on Diabetic diet, my plate method, 183 minutes of moderate intensity exercise/week Blood sugar logs with fasting goals of 80-120 mg/dl, random of less than 437 and in the event of sugars less than 60 mg/dl or greater than 357 mg/dl encouraged to notify the clinic. Advised on the need for annual eye exams, annual foot exams, Pneumonia vaccine.     Marcy Siren, D.O. 07/19/2019, 9:52 AM Primary Care at [facility]

## 2019-07-20 ENCOUNTER — Other Ambulatory Visit: Payer: Self-pay | Admitting: Internal Medicine

## 2019-07-20 ENCOUNTER — Encounter: Payer: Medicare Other | Admitting: Gastroenterology

## 2019-07-20 DIAGNOSIS — R7989 Other specified abnormal findings of blood chemistry: Secondary | ICD-10-CM

## 2019-07-20 LAB — BASIC METABOLIC PANEL
BUN/Creatinine Ratio: 21 (ref 10–24)
BUN: 28 mg/dL — ABNORMAL HIGH (ref 8–27)
CO2: 20 mmol/L (ref 20–29)
Calcium: 10 mg/dL (ref 8.6–10.2)
Chloride: 101 mmol/L (ref 96–106)
Creatinine, Ser: 1.32 mg/dL — ABNORMAL HIGH (ref 0.76–1.27)
GFR calc Af Amer: 64 mL/min/{1.73_m2} (ref >59)
GFR calc non Af Amer: 55 mL/min/{1.73_m2} — ABNORMAL LOW (ref >59)
Glucose: 102 mg/dL — ABNORMAL HIGH (ref 65–99)
Potassium: 5.2 mmol/L (ref 3.5–5.2)
Sodium: 137 mmol/L (ref 134–144)

## 2019-07-23 ENCOUNTER — Ambulatory Visit: Payer: Medicare Other | Admitting: Internal Medicine

## 2019-07-27 NOTE — Progress Notes (Signed)
Patient notified of results & recommendations. Expressed understanding. Scheduled a lab appointment for 07/30/2019 @ 9 AM.

## 2019-07-30 ENCOUNTER — Other Ambulatory Visit: Payer: Medicare Other

## 2019-07-30 ENCOUNTER — Other Ambulatory Visit: Payer: Self-pay

## 2019-07-30 DIAGNOSIS — R7989 Other specified abnormal findings of blood chemistry: Secondary | ICD-10-CM

## 2019-07-30 NOTE — Progress Notes (Signed)
Patient here for repeat BMP 

## 2019-07-31 LAB — BASIC METABOLIC PANEL
BUN/Creatinine Ratio: 24 (ref 10–24)
BUN: 28 mg/dL — ABNORMAL HIGH (ref 8–27)
CO2: 22 mmol/L (ref 20–29)
Calcium: 9.6 mg/dL (ref 8.6–10.2)
Chloride: 103 mmol/L (ref 96–106)
Creatinine, Ser: 1.17 mg/dL (ref 0.76–1.27)
GFR calc Af Amer: 74 mL/min/{1.73_m2} (ref >59)
GFR calc non Af Amer: 64 mL/min/{1.73_m2} (ref >59)
Glucose: 86 mg/dL (ref 65–99)
Potassium: 5.1 mmol/L (ref 3.5–5.2)
Sodium: 140 mmol/L (ref 134–144)

## 2019-08-20 ENCOUNTER — Ambulatory Visit: Payer: Medicare Other | Admitting: Internal Medicine

## 2019-08-22 ENCOUNTER — Telehealth: Payer: Self-pay

## 2019-08-22 NOTE — Telephone Encounter (Signed)
Called patient to do their pre-visit COVID screening.  Call went to voicemail. Unable to do prescreening.  

## 2019-08-23 ENCOUNTER — Encounter: Payer: Self-pay | Admitting: Internal Medicine

## 2019-08-23 ENCOUNTER — Other Ambulatory Visit: Payer: Self-pay

## 2019-08-23 ENCOUNTER — Ambulatory Visit (INDEPENDENT_AMBULATORY_CARE_PROVIDER_SITE_OTHER): Payer: Medicare Other | Admitting: Internal Medicine

## 2019-08-23 VITALS — BP 147/89 | HR 74 | Temp 97.3°F | Resp 17 | Wt 181.8 lb

## 2019-08-23 DIAGNOSIS — Z1211 Encounter for screening for malignant neoplasm of colon: Secondary | ICD-10-CM | POA: Diagnosis not present

## 2019-08-23 DIAGNOSIS — Z794 Long term (current) use of insulin: Secondary | ICD-10-CM | POA: Diagnosis not present

## 2019-08-23 DIAGNOSIS — I10 Essential (primary) hypertension: Secondary | ICD-10-CM | POA: Diagnosis not present

## 2019-08-23 DIAGNOSIS — E119 Type 2 diabetes mellitus without complications: Secondary | ICD-10-CM

## 2019-08-23 LAB — POCT GLYCOSYLATED HEMOGLOBIN (HGB A1C): Hemoglobin A1C: 6.1 % — AB (ref 4.0–5.6)

## 2019-08-23 MED ORDER — INSULIN GLARGINE 100 UNIT/ML SOLOSTAR PEN: 8.0000 [IU] | PEN_INJECTOR | Freq: Every day | SUBCUTANEOUS | 11 refills | Status: DC

## 2019-08-23 NOTE — Progress Notes (Signed)
Subjective:    [name] - 69 y.o. male MRN [id number]  Date of birth: Sep 19, 1950  HPI  [name] is here for follow up of chronic medical conditions.  Chronic HTN Disease Monitoring:  Home BP Monitoring - 120-130/80s  Chest pain- no  Dyspnea- no Headache - no  Medications: Losartan 50 mg, Amlodipine 10 mg  Compliance- yes Lightheadedness- no  Edema- no    Diabetes mellitus, Type 2 Disease Monitoring             Blood Sugar Ranges: Fasting - Avg 90s. Some in 34s and some in 110s.              Polyuria: no              Visual problems: yes, cataract surgery scheduled at end of April    Urine Microalbumin WNL on Arb therapy   Last A1C: 13.4 (06/08/19)   Medications: Metformin 500 mg BID, Lantus 15u, Jardiance 10 mg  Medication Compliance: yes  Medication Side Effects             Hypoglycemia: no       Health Maintenance:  Health Maintenance Due  Topic Date Due  . OPHTHALMOLOGY EXAM  Never done  . COLONOSCOPY  Never done    -  reports that he has never smoked. He has never used smokeless tobacco. - Review of Systems: Per HPI. - Past Medical History: Patient Active Problem List   Diagnosis Date Noted  . Candidate for statin therapy due to risk of future cardiovascular event 06/20/2019  . Polyneuropathy associated with underlying disease (HCC) 06/19/2019  . Type 2 diabetes mellitus without complication, with long-term current use of insulin (HCC) 06/19/2019  . Insomnia 06/19/2019  . Hyperosmolar hyperglycemic state (HHS) (HCC) 06/08/2019  . Essential hypertension 06/08/2019  . Tremor 08/29/2013   - Medications: reviewed and updated   Objective:   Physical Exam BP (!) 147/89   Pulse 74   Temp (!) 97.3 F (36.3 C) (Temporal)   Resp 17   Wt 181 lb 12.8 oz (82.5 kg)   SpO2 96%   BMI 26.46 kg/m  Physical Exam  Constitutional: He is oriented to person, place, and time and well-developed, well-nourished, and in no distress. No distress.   Cardiovascular: Normal rate.  Pulmonary/Chest: Effort normal. No respiratory distress.  Musculoskeletal:        General: Normal range of motion.  Neurological: He is alert and oriented to person, place, and time.  Skin: Skin is warm and dry. He is not diaphoretic.  Psychiatric: Affect and judgment normal.           Assessment & Plan:   1. Type 2 diabetes mellitus without complication, with long-term current use of insulin (HCC) A1c significantly improved to 6.1%. Will decrease insulin dose to 8u daily and monitor CBG trend. Optimal goal of d/c insulin completely. Will plan to repeat A1c in 3 months. Patient given parameters for hypoglycemia and hyperglycemia to call office in meantime. Reviewed signs/symptoms of hypoglycemia and proper management. Continue Metformin and Jardiance.  - Glucose (CBG) - HgB A1c  2. Essential hypertension BP elevated in office but normotensive/at goal on home readings. His BP reading with home cuff was also elevated here today and reading was within a few points of the office cuff. Suspect some degree of white coat HTN given well controlled home measurements. Continue current medication regimen.   3. Screening for colon cancer - Ambulatory referral to Gastroenterology  Phill Myron, D.O. 08/23/2019, 10:11 AM Primary Care at [facility]

## 2019-08-23 NOTE — Patient Instructions (Addendum)
On the day of your cataract surgery, I would recommend not taking your insulin or your Jardiance since you won't be eating. You can still take your Metformin that morning.   Your A1c was 6.1!! This is very good news! Reduce your insulin dose to 8 units a day and continue your oral medications. With the dose change, I do need you to check your blood sugars in the morning. Let me know if you have any blood sugars less than 70 or greater than 150 with the change in insulin.   Dr. Earlene Plater

## 2019-09-11 DIAGNOSIS — H25811 Combined forms of age-related cataract, right eye: Secondary | ICD-10-CM | POA: Diagnosis not present

## 2019-09-11 DIAGNOSIS — H2511 Age-related nuclear cataract, right eye: Secondary | ICD-10-CM | POA: Diagnosis not present

## 2019-10-03 DIAGNOSIS — H2512 Age-related nuclear cataract, left eye: Secondary | ICD-10-CM | POA: Diagnosis not present

## 2019-10-03 DIAGNOSIS — H25012 Cortical age-related cataract, left eye: Secondary | ICD-10-CM | POA: Diagnosis not present

## 2019-10-09 DIAGNOSIS — H2512 Age-related nuclear cataract, left eye: Secondary | ICD-10-CM | POA: Diagnosis not present

## 2019-10-09 DIAGNOSIS — H25012 Cortical age-related cataract, left eye: Secondary | ICD-10-CM | POA: Diagnosis not present

## 2019-10-09 DIAGNOSIS — H5712 Ocular pain, left eye: Secondary | ICD-10-CM | POA: Diagnosis not present

## 2019-10-09 DIAGNOSIS — H25812 Combined forms of age-related cataract, left eye: Secondary | ICD-10-CM | POA: Diagnosis not present

## 2019-10-22 ENCOUNTER — Other Ambulatory Visit: Payer: Self-pay | Admitting: Internal Medicine

## 2019-10-22 DIAGNOSIS — G4700 Insomnia, unspecified: Secondary | ICD-10-CM

## 2019-10-22 NOTE — Telephone Encounter (Signed)
1) Medication(s) Requested (by name):traZODone (DESYREL) 50 MG tablet [[id number]]  empagliflozin (JARDIANCE) 10 MG TABS tablet [[id number]]    2) Pharmacy of Choice:[facility] - [place], [place] - [address]  [address] Marye Round [place] [address]  Phone:  [phone] Fax:  [phone]   3) Special Requests:   Approved medications will be sent to the pharmacy, we will reach out if there is an issue.  Requests made after 3pm may not be addressed until the following business day!  If a patient is unsure of the name of the medication(s) please note and ask patient to call back when they are able to provide all info, do not send to responsible party until all information is available!

## 2019-10-22 NOTE — Telephone Encounter (Signed)
Please also send losartan (COZAAR) 50 MG tablet [[id number]

## 2019-10-23 MED ORDER — EMPAGLIFLOZIN 10 MG PO TABS: 10.0000 mg | ORAL_TABLET | Freq: Every day | ORAL | 1 refills | Status: DC

## 2019-10-23 NOTE — Telephone Encounter (Signed)
Losartan was sent to [facility] on 06/19/2019, #90 with 3 RF. Patient needs to call pharmacy to have them get Rx ready for pickup. Jardiance sent to pharmacy.  Will send Trazodone Rx to provider for refill.

## 2019-10-24 MED ORDER — TRAZODONE HCL 50 MG PO TABS: 25.0000 mg | ORAL_TABLET | Freq: Every evening | ORAL | 3 refills | Status: DC | PRN

## 2019-11-26 ENCOUNTER — Encounter: Payer: Self-pay | Admitting: Internal Medicine

## 2019-11-26 ENCOUNTER — Other Ambulatory Visit (INDEPENDENT_AMBULATORY_CARE_PROVIDER_SITE_OTHER): Payer: Medicare Other

## 2019-11-26 ENCOUNTER — Other Ambulatory Visit: Payer: Self-pay

## 2019-11-26 ENCOUNTER — Telehealth (INDEPENDENT_AMBULATORY_CARE_PROVIDER_SITE_OTHER): Payer: Medicare Other | Admitting: Internal Medicine

## 2019-11-26 DIAGNOSIS — E119 Type 2 diabetes mellitus without complications: Secondary | ICD-10-CM | POA: Diagnosis not present

## 2019-11-26 DIAGNOSIS — I10 Essential (primary) hypertension: Secondary | ICD-10-CM | POA: Diagnosis not present

## 2019-11-26 DIAGNOSIS — Z794 Long term (current) use of insulin: Secondary | ICD-10-CM

## 2019-11-26 DIAGNOSIS — G4700 Insomnia, unspecified: Secondary | ICD-10-CM

## 2019-11-26 LAB — POCT GLYCOSYLATED HEMOGLOBIN (HGB A1C): Hemoglobin A1C: 5.7 % — AB (ref 4.0–5.6)

## 2019-11-26 NOTE — Addendum Note (Signed)
Addended by: Heidi Dach on: 11/26/2019 01:48 PM   Modules accepted: Orders

## 2019-11-26 NOTE — Addendum Note (Signed)
Addended by: Heidi Dach on: 11/26/2019 02:44 PM   Modules accepted: Orders

## 2019-11-26 NOTE — Progress Notes (Signed)
Virtual Visit via Telephone Note  I connected with [name], on 11/26/2019 at 9:05 AM by telephone due to the COVID-19 pandemic and verified that I am speaking with the correct person using two identifiers.   Consent: I discussed the limitations, risks, security and privacy concerns of performing an evaluation and management service by telephone and the availability of in person appointments. I also discussed with the patient that there may be a patient responsible charge related to this service. The patient expressed understanding and agreed to proceed.   Location of Patient: Home   Location of Provider: Clinic    Persons participating in Telemedicine visit: Miliano Cotten [facility] Dr. Earlene Plater    History of Present Illness: Patient has a visit to follow up on chronic medical conditions.   Chronic HTN Disease Monitoring:  Home BP Monitoring - 120/70 this AM and consistent with his averages  Chest pain- no  Dyspnea- no Headache - no  Medications: Losartan 50 mg, Amlodipine 10 mg  Compliance- yes Lightheadedness- no  Edema- no    Diabetes mellitus, Type 2 Disease Monitoring             Blood Sugar Ranges: Fasting - 87 this AM---been running mostly 100-110s              Polyuria: no             Visual problems: no   Urine Microalbumin 11 ( Feb 2021)   Last A1C: 6.1 (April 2021)   Medications: Metformin 500 mg BID, Lantus 8u, Jardiance 10 mg  Medication Compliance: yes  Medication Side Effects             Hypoglycemia: no   His goal is to get off the insulin if his A1c is well controlled.   Reports that he just got back from vacation. He has started back on his exercise regimen. He works out at [employer] using the [employer]. Does spinning for cardio.    Past Medical History:  Diagnosis Date  . GERD (gastroesophageal reflux disease)   . Hypertension    has been off BP medicines for 20+ years  . Tinnitus    h/o exposure to noise  from machinery   Allergies  Allergen Reactions  . Sulfa Antibiotics Hives    Current Outpatient Medications on File Prior to Visit  Medication Sig Dispense Refill  . amLODipine (NORVASC) 10 MG tablet Take 1 tablet (10 mg total) by mouth daily. 90 tablet 1  . atorvastatin (LIPITOR) 80 MG tablet Take 1 tablet (80 mg total) by mouth daily. 90 tablet 3  . B-D ULTRAFINE III SHORT PEN 31G X 8 MM MISC 2 (two) times daily. as directed    . empagliflozin (JARDIANCE) 10 MG TABS tablet Take 1 tablet (10 mg total) by mouth daily before breakfast. 90 tablet 1  . insulin glargine (LANTUS) 100 UNIT/ML Solostar Pen Inject 8 Units into the skin daily. 15 mL 11  . ketorolac (ACULAR) 0.5 % ophthalmic solution Place 1 drop into the right eye 4 (four) times daily.    Marland Kitchen losartan (COZAAR) 50 MG tablet Take 1 tablet (50 mg total) by mouth daily. 90 tablet 3  . metFORMIN (GLUCOPHAGE) 500 MG tablet Take 1 tablet (500 mg total) by mouth 2 (two) times daily with a meal. 180 tablet 1  . OneTouch Delica Lancets 33G MISC Use to check blood sugars twice a day. Dx: E11.9, Z79.4 100 each 2  . ONETOUCH VERIO test strip Use  to check blood sugars twice a day. Dx: E11.9, Z79.4 100 each 2  . prednisoLONE acetate (PRED FORTE) 1 % ophthalmic suspension Place 1 drop into the right eye 4 (four) times daily.    . traZODone (DESYREL) 50 MG tablet Take 0.5-1 tablets (25-50 mg total) by mouth at bedtime as needed for sleep. 30 tablet 3   No current facility-administered medications on file prior to visit.    Observations/Objective: NAD. Speaking clearly.  Work of breathing normal.  Alert and oriented. Mood appropriate.   Assessment and Plan: 1. Type 2 diabetes mellitus without complication, with long-term current use of insulin (HCC) Drastic change in A1c trend from 13>6.0 over this year. His fasting CBGs are well controlled. Will repeat A1c and anticipate A1c to remain well controlled. If A1c shows good control, will plan to  discontinue insulin and continue oral medications.  Counseled on Diabetic diet, my plate method, 063 minutes of moderate intensity exercise/week Blood sugar logs with fasting goals of 80-120 mg/dl, random of less than 016 and in the event of sugars less than 60 mg/dl or greater than 010 mg/dl encouraged to notify the clinic. Advised on the need for annual eye exams, annual foot exams, Pneumonia vaccine. - HgB A1c; Future  2. Essential hypertension BP is well controlled on current regimen. Continue Losartan and Amlodipine.  Counseled on blood pressure goal of less than 130/80, low-sodium, DASH diet, medication compliance, 150 minutes of moderate intensity exercise per week. Discussed medication compliance, adverse effects.  3. Insomnia, unspecified type Reports that the Trazodone has helped tremendously with getting a good nights sleep. Has no concerns about sleep or side effects with medication.   Follow Up Instructions: Lab visit 7/12; 3 month f/u    I discussed the assessment and treatment plan with the patient. The patient was provided an opportunity to ask questions and all were answered. The patient agreed with the plan and demonstrated an understanding of the instructions.   The patient was advised to call back or seek an in-person evaluation if the symptoms worsen or if the condition fails to improve as anticipated.     I provided 12 minutes total of non-face-to-face time during this encounter including median intraservice time, reviewing previous notes, investigations, ordering medications, medical decision making, coordinating care and patient verbalized understanding at the end of the visit.    Marcy Siren, D.O. Primary Care at [facility]  11/26/2019, 9:05 AM

## 2019-11-26 NOTE — Progress Notes (Signed)
Patient in office for POCT A1C. Result was 5.7. Spoke with provider who states that he can stop insulin completely. Continue oral medications. Follow up in 3 months. Patient is to call office for fasting cbgs less than 70 or more than 180. Called patient to inform him of the recommendations & scheduled follow up appointment.

## 2019-12-07 ENCOUNTER — Telehealth: Payer: Self-pay | Admitting: Internal Medicine

## 2019-12-07 MED ORDER — AMLODIPINE BESYLATE 10 MG PO TABS: 10.0000 mg | ORAL_TABLET | Freq: Every day | ORAL | 1 refills | Status: DC

## 2019-12-07 MED ORDER — METFORMIN HCL 500 MG PO TABS: 500.0000 mg | ORAL_TABLET | Freq: Two times a day (BID) | ORAL | 1 refills | Status: DC

## 2019-12-07 NOTE — Telephone Encounter (Signed)
Amlodipine & Metformin sent to pharmacy.  Atorvastatin was prescribed 06/20/2019, #90 with 3 refills. He just needs to call pharmacy to get refilled.

## 2019-12-07 NOTE — Telephone Encounter (Signed)
1) Medication(s) Requested (by name): atorvastatin (LIPITOR) 80 MG tablet [[id number] metFORMIN (GLUCOPHAGE) 500 MG tablet  amLODipine (NORVASC) 10 MG tablet [[id number]  2) Pharmacy of Choice: [facility] - [place], [place] - [address]  [address] [place] Round [place] [address]  3) Special Requests:   Approved medications will be sent to the pharmacy, we will reach out if there is an issue.  Requests made after 3pm may not be addressed until the following business day!  If a patient is unsure of the name of the medication(s) please note and ask patient to call back when they are able to provide all info, do not send to responsible party until all information is available!

## 2020-03-13 ENCOUNTER — Other Ambulatory Visit: Payer: Self-pay

## 2020-03-13 DIAGNOSIS — G4700 Insomnia, unspecified: Secondary | ICD-10-CM

## 2020-03-13 MED ORDER — TRAZODONE HCL 50 MG PO TABS: 25.0000 mg | ORAL_TABLET | Freq: Every evening | ORAL | 0 refills | Status: DC | PRN

## 2020-03-13 NOTE — Telephone Encounter (Signed)
1) Medication(s) Requested (by name):traZODone (DESYREL) 50 MG tablet    2) Pharmacy of Choice:[facility] - [place], [place] - [address]  [address] [place] [address]   3) Special Requests:   Approved medications will be sent to the pharmacy, we will reach out if there is an issue.  Requests made after 3pm may not be addressed until the following business day!  If a patient is unsure of the name of the medication(s) please note and ask patient to call back when they are able to provide all info, do not send to responsible party until all information is available!

## 2020-03-31 ENCOUNTER — Ambulatory Visit: Payer: Medicare Other | Admitting: Internal Medicine

## 2020-04-08 ENCOUNTER — Other Ambulatory Visit: Payer: Self-pay

## 2020-04-08 ENCOUNTER — Ambulatory Visit (INDEPENDENT_AMBULATORY_CARE_PROVIDER_SITE_OTHER): Payer: Medicare Other | Admitting: Internal Medicine

## 2020-04-08 ENCOUNTER — Encounter: Payer: Self-pay | Admitting: Internal Medicine

## 2020-04-08 VITALS — BP 144/80 | HR 62 | Temp 97.3°F | Resp 17 | Wt 193.0 lb

## 2020-04-08 DIAGNOSIS — Z23 Encounter for immunization: Secondary | ICD-10-CM

## 2020-04-08 DIAGNOSIS — Z9189 Other specified personal risk factors, not elsewhere classified: Secondary | ICD-10-CM | POA: Diagnosis not present

## 2020-04-08 DIAGNOSIS — E1142 Type 2 diabetes mellitus with diabetic polyneuropathy: Secondary | ICD-10-CM

## 2020-04-08 DIAGNOSIS — I10 Essential (primary) hypertension: Secondary | ICD-10-CM | POA: Diagnosis not present

## 2020-04-08 DIAGNOSIS — Z794 Long term (current) use of insulin: Secondary | ICD-10-CM | POA: Diagnosis not present

## 2020-04-08 DIAGNOSIS — E119 Type 2 diabetes mellitus without complications: Secondary | ICD-10-CM | POA: Diagnosis not present

## 2020-04-08 LAB — POCT GLYCOSYLATED HEMOGLOBIN (HGB A1C): Hemoglobin A1C: 5.9 % — AB (ref 4.0–5.6)

## 2020-04-08 LAB — GLUCOSE, POCT (MANUAL RESULT ENTRY): POC Glucose: 114 mg/dl — AB (ref 70–99)

## 2020-04-08 MED ORDER — GABAPENTIN 100 MG PO CAPS: 100.0000 mg | ORAL_CAPSULE | Freq: Three times a day (TID) | ORAL | 3 refills | Status: DC

## 2020-04-08 MED ORDER — ATORVASTATIN CALCIUM 40 MG PO TABS: 40.0000 mg | ORAL_TABLET | Freq: Every day | ORAL | 3 refills | Status: DC

## 2020-04-08 NOTE — Progress Notes (Signed)
Subjective:    [name] - 69 y.o. male MRN [id number]  Date of birth: 11-26-1950  HPI  [name] is here for follow up of chronic medical conditions.  Diabetes mellitus, Type 2 Disease Monitoring             Blood Sugar Ranges: Fasting -90s              Polyuria: no             Visual problems: no   Urine Microalbumin 11 (Feb 2021) ---on Arb   Last A1C: 5.7 (July 2021)   Medications: Jardiance 10 mg, Metformin 500 mg BID  Medication Compliance: yes  Medication Side Effects             Hypoglycemia: yes, reports occasional episodes of what he thinks has been symptomatic hypoglycemia but occurred at times that he was unable to check his CBG    Chronic HTN Disease Monitoring:  Home BP Monitoring - Not monitoring Chest pain- no  Dyspnea- no Headache - no  Medications: Amlodipine 10 mg, Losartan 50 mg  Compliance- yes Lightheadedness- no  Edema- no        Health Maintenance:  Health Maintenance Due  Topic Date Due  . COLONOSCOPY  Never done    -  reports that he has never smoked. He has never used smokeless tobacco. - Review of Systems: Per HPI. - Past Medical History: Patient Active Problem List   Diagnosis Date Noted  . Candidate for statin therapy due to risk of future cardiovascular event 06/20/2019  . Polyneuropathy associated with underlying disease (HCC) 06/19/2019  . Type 2 diabetes mellitus without complication, with long-term current use of insulin (HCC) 06/19/2019  . Insomnia 06/19/2019  . Essential hypertension 06/08/2019  . Tremor 08/29/2013   - Medications: reviewed and updated   Objective:   Physical Exam BP (!) 144/80   Pulse 62   Temp (!) 97.3 F (36.3 C) (Temporal)   Resp 17   Wt 193 lb (87.5 kg)   SpO2 98%   BMI 28.09 kg/m  Physical Exam Constitutional:      General: He is not in acute distress.    Appearance: He is not diaphoretic.  Cardiovascular:     Rate and Rhythm: Normal rate.  Pulmonary:     Effort:  Pulmonary effort is normal. No respiratory distress.  Musculoskeletal:        General: Normal range of motion.  Skin:    General: Skin is warm and dry.  Neurological:     Mental Status: He is alert and oriented to person, place, and time.  Psychiatric:        Mood and Affect: Affect normal.        Judgment: Judgment normal.            Assessment & Plan:   1. Type 2 diabetes mellitus without complication, with long-term current use of insulin (HCC) A1c 5.9% with excellent control. At last visit, insulin was discontinued and glucose trend has remained stable. Patient reports Jardiance very expensive and given optimal A1c control and concern of occasional hypoglycemic episodes, will d/c this medication. Continue metformin.  Counseled on Diabetic diet, my plate method, 786 minutes of moderate intensity exercise/week Blood sugar logs with fasting goals of 80-120 mg/dl, random of less than 767 and in the event of sugars less than 60 mg/dl or greater than 209 mg/dl encouraged to notify the clinic. Advised on the need for annual eye exams, annual foot  exams, Pneumonia vaccine. - Glucose (CBG) - HgB A1c  2. Essential hypertension BP slightly above goal. However, likely attributable to being in the doctor's office. Asked patient to monitor at home as he does have cuff and parameters given to call. Continue current regimen.   3. Needs flu shot - Flu Vaccine QUAD 6+ mos PF IM (Fluarix Quad PF)  4. Candidate for statin therapy due to risk of future cardiovascular event Patient decreased Lipitor from 80 mg to 40 mg due to myalgias which have resolved with dose decrease. Continue on 40 mg.  - atorvastatin (LIPITOR) 40 MG tablet; Take 1 tablet (40 mg total) by mouth daily.  Dispense: 90 tablet; Refill: 3  5. Diabetic polyneuropathy associated with type 2 diabetes mellitus (HCC) Patient reports pins/needles and burning sensation in feet consistent with neuropathic pain. Trial Gabapentin.  Reviewed that renal function is stable.  - gabapentin (NEURONTIN) 100 MG capsule; Take 1 capsule (100 mg total) by mouth 3 (three) times daily.  Dispense: 90 capsule; Refill: 3    [name], D.O. 04/08/2020, 11:02 AM Primary Care at [facility]

## 2020-04-21 ENCOUNTER — Other Ambulatory Visit: Payer: Self-pay

## 2020-04-21 DIAGNOSIS — G4700 Insomnia, unspecified: Secondary | ICD-10-CM

## 2020-04-21 MED ORDER — TRAZODONE HCL 50 MG PO TABS: 25.0000 mg | ORAL_TABLET | Freq: Every evening | ORAL | 1 refills | Status: DC | PRN

## 2020-05-28 ENCOUNTER — Other Ambulatory Visit: Payer: Self-pay | Admitting: Internal Medicine

## 2020-06-04 ENCOUNTER — Other Ambulatory Visit: Payer: Self-pay | Admitting: Internal Medicine

## 2020-06-10 ENCOUNTER — Other Ambulatory Visit: Payer: Self-pay | Admitting: Internal Medicine

## 2020-06-10 DIAGNOSIS — G4700 Insomnia, unspecified: Secondary | ICD-10-CM

## 2020-07-14 ENCOUNTER — Other Ambulatory Visit: Payer: Self-pay

## 2020-07-14 ENCOUNTER — Ambulatory Visit: Payer: Medicare Other | Admitting: Internal Medicine

## 2020-07-14 ENCOUNTER — Telehealth: Payer: Self-pay | Admitting: Internal Medicine

## 2020-07-14 MED ORDER — ONETOUCH DELICA LANCETS 33G MISC: 2 refills | Status: AC

## 2020-07-14 MED ORDER — ONETOUCH VERIO VI STRP: ORAL_STRIP | 2 refills | Status: AC

## 2020-07-14 NOTE — Telephone Encounter (Signed)
RF sent.

## 2020-07-14 NOTE — Telephone Encounter (Signed)
1) Medication(s) Requested (by name): LANCETS AND TEST STRIPS.  2) Pharmacy of Choice: [facility] - [place], [place] - [address] (Ph: [phone]) 3) Special Requests:   Approved medications will be sent to the pharmacy, we will reach out if there is an issue.  Requests made after 3pm may not be addressed until the following business day!  If a patient is unsure of the name of the medication(s) please note and ask patient to call back when they are able to provide all info, do not send to responsible party until all information is available!

## 2020-07-16 ENCOUNTER — Ambulatory Visit: Payer: Medicare Other | Admitting: Internal Medicine

## 2020-07-25 ENCOUNTER — Other Ambulatory Visit: Payer: Self-pay | Admitting: Internal Medicine

## 2020-07-25 DIAGNOSIS — I10 Essential (primary) hypertension: Secondary | ICD-10-CM

## 2020-07-31 ENCOUNTER — Other Ambulatory Visit: Payer: Self-pay

## 2020-07-31 DIAGNOSIS — I10 Essential (primary) hypertension: Secondary | ICD-10-CM

## 2020-07-31 MED ORDER — LOSARTAN POTASSIUM 50 MG PO TABS: 50.0000 mg | ORAL_TABLET | Freq: Every day | ORAL | 0 refills | Status: DC

## 2020-07-31 NOTE — Progress Notes (Signed)
Pt was sent 30 day supply of Losartan due to pt out and feeling symptoms of elevate BP, advised pt spouse that med is no longer on formulary and insurance is not covering it, it would have to be changed, spouse [name]) requested that it still be sent and they will deal with cost.

## 2020-08-04 ENCOUNTER — Other Ambulatory Visit: Payer: Self-pay

## 2020-08-04 ENCOUNTER — Ambulatory Visit (INDEPENDENT_AMBULATORY_CARE_PROVIDER_SITE_OTHER): Payer: Medicare Other | Admitting: Internal Medicine

## 2020-08-04 VITALS — BP 153/94 | HR 58 | Temp 97.5°F | Ht 69.5 in | Wt 199.4 lb

## 2020-08-04 DIAGNOSIS — E119 Type 2 diabetes mellitus without complications: Secondary | ICD-10-CM

## 2020-08-04 DIAGNOSIS — I10 Essential (primary) hypertension: Secondary | ICD-10-CM | POA: Diagnosis not present

## 2020-08-04 DIAGNOSIS — Z9189 Other specified personal risk factors, not elsewhere classified: Secondary | ICD-10-CM | POA: Diagnosis not present

## 2020-08-04 DIAGNOSIS — Z794 Long term (current) use of insulin: Secondary | ICD-10-CM | POA: Diagnosis not present

## 2020-08-04 LAB — POCT GLYCOSYLATED HEMOGLOBIN (HGB A1C): Hemoglobin A1C: 5.2 % (ref 4.0–5.6)

## 2020-08-04 NOTE — Progress Notes (Signed)
Pt states he has been having issues getting refills. He is unsure if its pharmacy or PCP office

## 2020-08-04 NOTE — Progress Notes (Signed)
Subjective:    [name] - 70 y.o. male MRN [id number]  Date of birth: 1950/07/31  HPI  [name] is here for chronic medical condition f/u. Has concerns that he has "fallen off the exercise program" he was previously doing. Was going to [employer] but there was an issue with the [employer] program and now want to charge full price. He liked going to the gym and needs to get back into it. Also admits that it would probably be good for him, his wife, adult son, and their two labs to go out walking as a family. Thinks he has been slipping a bit with diet as well.   Chronic HTN Disease Monitoring:  Home BP Monitoring - Not monitoring  Chest pain- no  Dyspnea- no Headache - no  Medications: Amlodipine 10 mg, Losartan 50 mg  Compliance- yes, although ran out of medications for about 4-5 days Lightheadedness- no  Edema- no    Diabetes mellitus, Type 2 Disease Monitoring             Blood Sugar Ranges: Fasting - 90s when does monitor              Polyuria: no             Visual problems: no   Urine Microalbumin 11 (Feb 2021) ---on Losartan   Last A1C: 5.9 (Nov 2021)   Medications: Metfomrin 500 mg BID  Medication Compliance: yes  Medication Side Effects             Hypoglycemia: no       Health Maintenance:  Health Maintenance Due  Topic Date Due  . COLONOSCOPY (Pts 45-10yrs Insurance coverage will need to be confirmed)  Never done  . FOOT EXAM  06/18/2020  . PNA vac Low Risk Adult (2 of 2 - PCV13) 06/18/2020  . OPHTHALMOLOGY EXAM  06/27/2020    -  reports that he has never smoked. He has never used smokeless tobacco. - Review of Systems: Per HPI. - Past Medical History: Patient Active Problem List   Diagnosis Date Noted  . Candidate for statin therapy due to risk of future cardiovascular event 06/20/2019  . Polyneuropathy associated with underlying disease (HCC) 06/19/2019  . Type 2 diabetes mellitus without complication, with long-term current  use of insulin (HCC) 06/19/2019  . Insomnia 06/19/2019  . Essential hypertension 06/08/2019  . Tremor 08/29/2013   - Medications: reviewed and updated   Objective:   Physical Exam BP (!) 153/94 (BP Location: Right Arm, Patient Position: Sitting, Cuff Size: Normal)   Pulse (!) 58   Temp (!) 97.5 F (36.4 C) (Temporal)   Ht 5' 9.5" (1.765 m)   Wt 199 lb 6.4 oz (90.4 kg)   SpO2 98%   BMI 29.02 kg/m  Physical Exam Constitutional:      General: He is not in acute distress.    Appearance: He is not diaphoretic.  HENT:     Head: Normocephalic and atraumatic.  Eyes:     Conjunctiva/sclera: Conjunctivae normal.  Cardiovascular:     Rate and Rhythm: Normal rate and regular rhythm.     Heart sounds: Normal heart sounds. No murmur heard.   Pulmonary:     Effort: Pulmonary effort is normal. No respiratory distress.     Breath sounds: Normal breath sounds.  Musculoskeletal:        General: Normal range of motion.  Skin:    General: Skin is warm and dry.  Neurological:  Mental Status: He is alert and oriented to person, place, and time.  Psychiatric:        Mood and Affect: Affect normal.        Judgment: Judgment normal.            Assessment & Plan:   1. Type 2 diabetes mellitus without complication, with long-term current use of insulin (HCC) A1c is 5.2, excellent control. Monitor microalbumin and continue Losartan for renal protection.  Counseled on Diabetic diet, my plate method, 045 minutes of moderate intensity exercise/week Blood sugar logs with fasting goals of 80-120 mg/dl, random of less than 409 and in the event of sugars less than 60 mg/dl or greater than 811 mg/dl encouraged to notify the clinic. Advised on the need for annual eye exams, annual foot exams, Pneumonia vaccine. - Microalbumin / creatinine urine ratio - HgB A1c  2. Essential hypertension Discussed that BP is above goal. Suspect related to some lapse of medications as well as drastic change  in exercise regimen. Discussed benefit of exercise and diet on BP control and encouraged re-forming these healthy habits. Would like to avoid additional medications if possible and previously has had more optimal control. Continue current regimen with lifestyle changes and return in 3 months.  Counseled on blood pressure goal of less than 130/80, low-sodium, DASH diet, medication compliance, 150 minutes of moderate intensity exercise per week. Discussed medication compliance, adverse effects.  - Comprehensive metabolic panel  3. Candidate for statin therapy due to risk of future cardiovascular event Patient compliant with Lipitor. Monitor lipid panel.  - Lipid panel  Marcy Siren, D.O. 08/04/2020, 3:14 PM Primary Care at [facility]

## 2020-08-06 ENCOUNTER — Other Ambulatory Visit: Payer: Self-pay | Admitting: Internal Medicine

## 2020-08-06 DIAGNOSIS — G4700 Insomnia, unspecified: Secondary | ICD-10-CM

## 2020-08-06 LAB — LIPID PANEL
Chol/HDL Ratio: 3.5 ratio (ref 0.0–5.0)
Cholesterol, Total: 149 mg/dL (ref 100–199)
HDL: 42 mg/dL (ref >39)
LDL Chol Calc (NIH): 68 mg/dL (ref 0–99)
Triglycerides: 237 mg/dL — ABNORMAL HIGH (ref 0–149)
VLDL Cholesterol Cal: 39 mg/dL (ref 5–40)

## 2020-08-06 LAB — COMPREHENSIVE METABOLIC PANEL
ALT: 27 IU/L (ref 0–44)
AST: 21 IU/L (ref 0–40)
Albumin/Globulin Ratio: 1.7 (ref 1.2–2.2)
Albumin: 5 g/dL — ABNORMAL HIGH (ref 3.8–4.8)
Alkaline Phosphatase: 100 IU/L (ref 44–121)
BUN/Creatinine Ratio: 21 (ref 10–24)
BUN: 23 mg/dL (ref 8–27)
Bilirubin Total: 0.5 mg/dL (ref 0.0–1.2)
CO2: 19 mmol/L — ABNORMAL LOW (ref 20–29)
Calcium: 10.1 mg/dL (ref 8.6–10.2)
Chloride: 100 mmol/L (ref 96–106)
Creatinine, Ser: 1.09 mg/dL (ref 0.76–1.27)
Globulin, Total: 3 g/dL (ref 1.5–4.5)
Glucose: 92 mg/dL (ref 65–99)
Potassium: 4.5 mmol/L (ref 3.5–5.2)
Sodium: 137 mmol/L (ref 134–144)
Total Protein: 8 g/dL (ref 6.0–8.5)
eGFR: 74 mL/min/{1.73_m2} (ref >59)

## 2020-08-06 LAB — MICROALBUMIN / CREATININE URINE RATIO

## 2020-08-07 ENCOUNTER — Telehealth: Payer: Self-pay | Admitting: Internal Medicine

## 2020-08-07 NOTE — Telephone Encounter (Signed)
Pt called checking status of refill TRAZADONE. Please advise.

## 2020-08-08 ENCOUNTER — Other Ambulatory Visit: Payer: Self-pay | Admitting: Internal Medicine

## 2020-08-08 DIAGNOSIS — G4700 Insomnia, unspecified: Secondary | ICD-10-CM

## 2020-08-08 MED ORDER — TRAZODONE HCL 50 MG PO TABS: 25.0000 mg | ORAL_TABLET | Freq: Every evening | ORAL | 1 refills | Status: DC | PRN

## 2020-08-08 NOTE — Telephone Encounter (Signed)
Trazodone refilled.   Marcy Siren, D.O. Primary Care at [facility]  08/08/2020, 9:04 AM

## 2020-08-28 ENCOUNTER — Other Ambulatory Visit: Payer: Self-pay | Admitting: Internal Medicine

## 2020-08-28 DIAGNOSIS — I10 Essential (primary) hypertension: Secondary | ICD-10-CM

## 2020-09-26 ENCOUNTER — Other Ambulatory Visit: Payer: Self-pay | Admitting: Internal Medicine

## 2020-09-26 DIAGNOSIS — I10 Essential (primary) hypertension: Secondary | ICD-10-CM

## 2020-09-30 ENCOUNTER — Other Ambulatory Visit: Payer: Self-pay | Admitting: Internal Medicine

## 2020-09-30 DIAGNOSIS — E1142 Type 2 diabetes mellitus with diabetic polyneuropathy: Secondary | ICD-10-CM

## 2020-10-03 ENCOUNTER — Other Ambulatory Visit: Payer: Self-pay | Admitting: Internal Medicine

## 2020-10-03 DIAGNOSIS — I10 Essential (primary) hypertension: Secondary | ICD-10-CM

## 2020-10-03 DIAGNOSIS — E1142 Type 2 diabetes mellitus with diabetic polyneuropathy: Secondary | ICD-10-CM

## 2020-10-03 MED ORDER — LOSARTAN POTASSIUM 50 MG PO TABS: 50.0000 mg | ORAL_TABLET | Freq: Every day | ORAL | 0 refills | Status: DC

## 2020-10-03 NOTE — Telephone Encounter (Signed)
Pt wife calling in stating pt needs his medications refilled  losartan (COZAAR) 50 MG tablet  gabapentin (NEURONTIN) 100 MG capsule  Pharmacy  [facility] - [place], [place] - [address]  [address] [place] [address]  Phone:  [phone] Fax:  [phone]

## 2020-10-03 NOTE — Addendum Note (Signed)
Addended by: Guy Franco on: 10/03/2020 12:51 PM   Modules accepted: Orders

## 2020-10-06 ENCOUNTER — Other Ambulatory Visit: Payer: Self-pay | Admitting: Internal Medicine

## 2020-10-06 DIAGNOSIS — G4700 Insomnia, unspecified: Secondary | ICD-10-CM

## 2020-10-06 MED ORDER — GABAPENTIN 100 MG PO CAPS: 100.0000 mg | ORAL_CAPSULE | Freq: Three times a day (TID) | ORAL | 3 refills | Status: DC

## 2020-12-04 ENCOUNTER — Other Ambulatory Visit: Payer: Self-pay

## 2020-12-04 ENCOUNTER — Other Ambulatory Visit: Payer: Self-pay | Admitting: Family

## 2020-12-04 DIAGNOSIS — I10 Essential (primary) hypertension: Secondary | ICD-10-CM

## 2020-12-04 DIAGNOSIS — E119 Type 2 diabetes mellitus without complications: Secondary | ICD-10-CM

## 2020-12-04 DIAGNOSIS — Z794 Long term (current) use of insulin: Secondary | ICD-10-CM

## 2020-12-04 MED ORDER — AMLODIPINE BESYLATE 10 MG PO TABS
10.0000 mg | ORAL_TABLET | Freq: Every day | ORAL | 0 refills | Status: DC
Start: 2020-12-04 — End: 2020-12-16

## 2020-12-04 MED ORDER — METFORMIN HCL 500 MG PO TABS: 500.0000 mg | ORAL_TABLET | Freq: Two times a day (BID) | ORAL | 0 refills | Status: DC

## 2020-12-04 NOTE — Progress Notes (Signed)
Per patient request Amlodipine refilled for courtesy 30 day supply. Please  schedule appointment for additional refills
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
## 2020-12-16 ENCOUNTER — Ambulatory Visit (INDEPENDENT_AMBULATORY_CARE_PROVIDER_SITE_OTHER): Payer: Medicare Other | Admitting: Family

## 2020-12-16 ENCOUNTER — Other Ambulatory Visit: Payer: Self-pay

## 2020-12-16 VITALS — Temp 98.3°F | Resp 18 | Ht 69.49 in | Wt 198.8 lb

## 2020-12-16 DIAGNOSIS — R609 Edema, unspecified: Secondary | ICD-10-CM | POA: Diagnosis not present

## 2020-12-16 DIAGNOSIS — E119 Type 2 diabetes mellitus without complications: Secondary | ICD-10-CM

## 2020-12-16 DIAGNOSIS — I10 Essential (primary) hypertension: Secondary | ICD-10-CM | POA: Diagnosis not present

## 2020-12-16 DIAGNOSIS — Z794 Long term (current) use of insulin: Secondary | ICD-10-CM | POA: Diagnosis not present

## 2020-12-16 DIAGNOSIS — E1142 Type 2 diabetes mellitus with diabetic polyneuropathy: Secondary | ICD-10-CM | POA: Diagnosis not present

## 2020-12-16 DIAGNOSIS — G4700 Insomnia, unspecified: Secondary | ICD-10-CM

## 2020-12-16 LAB — POCT GLYCOSYLATED HEMOGLOBIN (HGB A1C): Hemoglobin A1C: 6.2 % — AB (ref 4.0–5.6)

## 2020-12-16 MED ORDER — FUROSEMIDE 20 MG PO TABS: 20.0000 mg | ORAL_TABLET | Freq: Every day | ORAL | 0 refills | Status: AC

## 2020-12-16 MED ORDER — LOSARTAN POTASSIUM 50 MG PO TABS: 50.0000 mg | ORAL_TABLET | Freq: Every day | ORAL | 0 refills | Status: DC

## 2020-12-16 MED ORDER — TRAZODONE HCL 50 MG PO TABS: 25.0000 mg | ORAL_TABLET | Freq: Every evening | ORAL | 1 refills | Status: DC | PRN

## 2020-12-16 MED ORDER — AMLODIPINE BESYLATE 10 MG PO TABS: 10.0000 mg | ORAL_TABLET | Freq: Every day | ORAL | 0 refills | Status: DC

## 2020-12-16 MED ORDER — METFORMIN HCL 500 MG PO TABS: 500.0000 mg | ORAL_TABLET | Freq: Two times a day (BID) | ORAL | 0 refills | Status: DC

## 2020-12-16 MED ORDER — GABAPENTIN 100 MG PO CAPS: 200.0000 mg | ORAL_CAPSULE | Freq: Two times a day (BID) | ORAL | 1 refills | Status: DC

## 2020-12-16 NOTE — Progress Notes (Signed)
Pt presents for diabetes check, has been experiencing some bilateral leg edema mostly in evenings denies pain but numbness and tightness is present. Pt notices when he elevates legs it helps Needs refill on Trazodone

## 2020-12-17 LAB — BASIC METABOLIC PANEL
BUN/Creatinine Ratio: 24 (ref 10–24)
BUN: 31 mg/dL — ABNORMAL HIGH (ref 8–27)
CO2: 19 mmol/L — ABNORMAL LOW (ref 20–29)
Calcium: 9.4 mg/dL (ref 8.6–10.2)
Chloride: 101 mmol/L (ref 96–106)
Creatinine, Ser: 1.31 mg/dL — ABNORMAL HIGH (ref 0.76–1.27)
Glucose: 90 mg/dL (ref 65–99)
Potassium: 4.4 mmol/L (ref 3.5–5.2)
Sodium: 138 mmol/L (ref 134–144)
eGFR: 59 mL/min/{1.73_m2} — ABNORMAL LOW (ref >59)

## 2020-12-17 NOTE — Progress Notes (Signed)
Kidney function not 100%. Over the counter medications such as Ibuprofen (Advil) or Naproxyn (Aleve) can be damaging to your kidneys and try to avoid those as much as possible. Tylenol or Acetaminophen is a safer option to use. Patient encouraged to return in 4 to 6 weeks for lab only visit to recheck kidney function. Please call our office to schedule lab only appointment.   Diabetes discussed in office.

## 2020-12-22 ENCOUNTER — Ambulatory Visit: Payer: Medicare Other | Admitting: Internal Medicine

## 2020-12-25 ENCOUNTER — Other Ambulatory Visit: Payer: Self-pay | Admitting: Family

## 2020-12-25 DIAGNOSIS — R609 Edema, unspecified: Secondary | ICD-10-CM

## 2021-02-05 ENCOUNTER — Telehealth: Payer: Self-pay | Admitting: Internal Medicine

## 2021-02-05 NOTE — Telephone Encounter (Signed)
Left message for patient to call back and schedule Medicare Annual Wellness Visit (AWV)   Maureen Chatters 06/17/18 per [facility]   please schedule at anytime with health coach

## 2021-02-09 ENCOUNTER — Other Ambulatory Visit: Payer: Self-pay | Admitting: Family

## 2021-02-09 DIAGNOSIS — G4700 Insomnia, unspecified: Secondary | ICD-10-CM

## 2021-02-13 ENCOUNTER — Telehealth: Payer: Self-pay | Admitting: Internal Medicine

## 2021-02-13 NOTE — Telephone Encounter (Signed)
Patient called in stating he needs his medication refilled  traZODone (DESYREL) 50 MG tablet    Pharmacy  [address] [facility] - [place], [place] - [address]  [address] [place] [address]  Phone:  [phone]  Fax:  [phone]  DEA #:  --

## 2021-02-17 NOTE — Telephone Encounter (Signed)
Trazodone (Desyrel) ordered on 02/16/2021 for 30 day courtesy refill by Georganna Skeans, MD. Please keep all scheduled appointments.

## 2021-02-25 ENCOUNTER — Other Ambulatory Visit: Payer: Self-pay | Admitting: Family

## 2021-02-25 DIAGNOSIS — E1142 Type 2 diabetes mellitus with diabetic polyneuropathy: Secondary | ICD-10-CM

## 2021-03-02 ENCOUNTER — Encounter: Payer: Self-pay | Admitting: Nurse Practitioner

## 2021-03-02 ENCOUNTER — Telehealth (INDEPENDENT_AMBULATORY_CARE_PROVIDER_SITE_OTHER): Payer: Medicare Other | Admitting: Nurse Practitioner

## 2021-03-02 DIAGNOSIS — U071 COVID-19: Secondary | ICD-10-CM | POA: Diagnosis not present

## 2021-03-02 MED ORDER — MOLNUPIRAVIR EUA 200MG CAPSULE: 4.0000 | ORAL_CAPSULE | Freq: Two times a day (BID) | ORAL | 0 refills | Status: AC

## 2021-03-02 NOTE — Progress Notes (Signed)
Virtual Visit via Video Note  I connected with [name] on 03/02/21 at  9:00 AM EDT by a video enabled telemedicine application and verified that I am speaking with the correct person using two identifiers.  Location: Patient: home Provider: office   I discussed the limitations of evaluation and management by telemedicine and the availability of in person appointments. The patient expressed understanding and agreed to proceed.  History of Present Illness:  Patient presents today for acute visit through video visit.  Patient states that on Friday, February 27, 2021 he started having symptoms of fever, cough, head congestion and chest congestion.  He states that he did test positive for COVID on Friday.  Patient is diabetic and does have hypertension.  We discussed with his comorbidities plus age he does qualify for oral antiviral therapy.  Patient states that he is able to check his oxygen level at home and has been staying around 97%.  Overall he is stable at this point.  We discussed that if symptoms worsen or oxygen level drops to please go straight to the ED. Denies f/c/s, n/v/d, hemoptysis, PND, chest pain or edema     Observations/Objective:  Vitals with BMI 12/16/2020 08/04/2020 08/04/2020  Height 5' 9.488" - 5' 9.5"  Weight 198 lbs 13 oz - 199 lbs 6 oz  BMI 28.95 - 29.03  Systolic - 153 162  Diastolic - 94 91  Pulse - 58 66      Assessment and Plan:   Covid 19:   Stay well hydrated  Stay active  Deep breathing exercises  May take tylenol or fever or pain  May take mucinex twice daily    You are being prescribed MOLNUPIRAVIR for COVID-19 infection.    Please call the pharmacy or go through the drive through vs going inside if you are picking up the mediation yourself to prevent further spread. If prescribed to a [facility] affiliated pharmacy, a pharmacist will bring the medication out to your car.   ADMINISTRATION INSTRUCTIONS: Take with or without food.  Swallow the tablets whole. Don't chew, crush, or break the medications because it might not work as well  For each dose of the medication, you should be taking FOUR tablets at one time, TWICE a day   Finish your full five-day course of Molnupiravir even if you feel better before you're done. Stopping this medication too early can make it less effective to prevent severe illness related to COVID19.    Molnupiravir is prescribed for YOU ONLY. Don't share it with others, even if they have similar symptoms as you. This medication might not be right for everyone.   Make sure to take steps to protect yourself and others while you're taking this medication in order to get well soon and to prevent others from getting sick with COVID-19.   **If you are of childbearing potential (any gender) - it is advised to not get pregnant while taking this medication and recommended that condoms are used for male partners the next 3 months after taking the medication out of extreme caution    COMMON SIDE EFFECTS: Diarrhea Nausea  Dizziness    If your COVID-19 symptoms get worse, get medical help right away. Call 911 if you experience symptoms such as worsening cough, trouble breathing, chest pain that doesn't go away, confusion, a hard time staying awake, and pale or blue-colored skin. This medication won't prevent all COVID-19 cases from getting worse.      Follow up:  Follow up  if needed     I discussed the assessment and treatment plan with the patient. The patient was provided an opportunity to ask questions and all were answered. The patient agreed with the plan and demonstrated an understanding of the instructions.   The patient was advised to call back or seek an in-person evaluation if the symptoms worsen or if the condition fails to improve as anticipated.  I provided 23 minutes of non-face-to-face time during this encounter.   Ivonne Andrew, NP

## 2021-03-02 NOTE — Patient Instructions (Signed)
Covid 19:   Stay well hydrated  Stay active  Deep breathing exercises  May take tylenol or fever or pain  May take mucinex twice daily    You are being prescribed MOLNUPIRAVIR for COVID-19 infection.    Please call the pharmacy or go through the drive through vs going inside if you are picking up the mediation yourself to prevent further spread. If prescribed to a [facility] affiliated pharmacy, a pharmacist will bring the medication out to your car.   ADMINISTRATION INSTRUCTIONS: Take with or without food. Swallow the tablets whole. Don't chew, crush, or break the medications because it might not work as well  For each dose of the medication, you should be taking FOUR tablets at one time, TWICE a day   Finish your full five-day course of Molnupiravir even if you feel better before you're done. Stopping this medication too early can make it less effective to prevent severe illness related to COVID19.    Molnupiravir is prescribed for YOU ONLY. Don't share it with others, even if they have similar symptoms as you. This medication might not be right for everyone.   Make sure to take steps to protect yourself and others while you're taking this medication in order to get well soon and to prevent others from getting sick with COVID-19.   **If you are of childbearing potential (any gender) - it is advised to not get pregnant while taking this medication and recommended that condoms are used for male partners the next 3 months after taking the medication out of extreme caution    COMMON SIDE EFFECTS: Diarrhea Nausea  Dizziness    If your COVID-19 symptoms get worse, get medical help right away. Call 911 if you experience symptoms such as worsening cough, trouble breathing, chest pain that doesn't go away, confusion, a hard time staying awake, and pale or blue-colored skin. This medication won't prevent all COVID-19 cases from getting worse.      Follow up:  Follow up if  needed
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
## 2021-03-18 ENCOUNTER — Other Ambulatory Visit: Payer: Self-pay

## 2021-03-18 ENCOUNTER — Ambulatory Visit (INDEPENDENT_AMBULATORY_CARE_PROVIDER_SITE_OTHER): Payer: Medicare Other | Admitting: Family

## 2021-03-18 ENCOUNTER — Encounter: Payer: Self-pay | Admitting: Family

## 2021-03-18 VITALS — BP 124/91 | HR 85 | Resp 20 | Ht 69.0 in | Wt 197.0 lb

## 2021-03-18 DIAGNOSIS — G4700 Insomnia, unspecified: Secondary | ICD-10-CM

## 2021-03-18 DIAGNOSIS — I10 Essential (primary) hypertension: Secondary | ICD-10-CM

## 2021-03-18 DIAGNOSIS — E1142 Type 2 diabetes mellitus with diabetic polyneuropathy: Secondary | ICD-10-CM | POA: Diagnosis not present

## 2021-03-18 LAB — POCT GLYCOSYLATED HEMOGLOBIN (HGB A1C): HbA1c, POC (prediabetic range): 6.3 % (ref 5.7–6.4)

## 2021-03-18 MED ORDER — AMLODIPINE BESYLATE 10 MG PO TABS: 10.0000 mg | ORAL_TABLET | Freq: Every day | ORAL | 0 refills | Status: DC

## 2021-03-18 MED ORDER — GABAPENTIN 100 MG PO CAPS: ORAL_CAPSULE | ORAL | 2 refills | Status: DC

## 2021-03-18 MED ORDER — TRAZODONE HCL 50 MG PO TABS: 25.0000 mg | ORAL_TABLET | Freq: Every evening | ORAL | 0 refills | Status: DC | PRN

## 2021-03-18 MED ORDER — METFORMIN HCL 500 MG PO TABS: 500.0000 mg | ORAL_TABLET | Freq: Two times a day (BID) | ORAL | 0 refills | Status: DC

## 2021-03-18 MED ORDER — LOSARTAN POTASSIUM 50 MG PO TABS: 50.0000 mg | ORAL_TABLET | Freq: Every day | ORAL | 0 refills | Status: DC

## 2021-03-18 NOTE — Progress Notes (Signed)
Needs refill on Trazadone

## 2021-03-18 NOTE — Progress Notes (Signed)
Diabetes discussed in office.

## 2021-03-20 LAB — BASIC METABOLIC PANEL
BUN/Creatinine Ratio: 21 (ref 10–24)
BUN: 25 mg/dL (ref 8–27)
CO2: 19 mmol/L — ABNORMAL LOW (ref 20–29)
Calcium: 10 mg/dL (ref 8.6–10.2)
Chloride: 101 mmol/L (ref 96–106)
Creatinine, Ser: 1.17 mg/dL (ref 0.76–1.27)
Glucose: 97 mg/dL (ref 70–99)
Potassium: 5.1 mmol/L (ref 3.5–5.2)
Sodium: 138 mmol/L (ref 134–144)
eGFR: 68 mL/min/{1.73_m2} (ref >59)

## 2021-03-20 LAB — SPECIMEN STATUS REPORT

## 2021-03-20 NOTE — Progress Notes (Signed)
Kidney function normal

## 2021-04-13 ENCOUNTER — Other Ambulatory Visit: Payer: Self-pay | Admitting: Internal Medicine

## 2021-04-13 DIAGNOSIS — Z9189 Other specified personal risk factors, not elsewhere classified: Secondary | ICD-10-CM

## 2021-05-16 DIAGNOSIS — E119 Type 2 diabetes mellitus without complications: Secondary | ICD-10-CM | POA: Diagnosis not present

## 2021-05-16 DIAGNOSIS — H524 Presbyopia: Secondary | ICD-10-CM | POA: Diagnosis not present

## 2021-05-16 DIAGNOSIS — H40033 Anatomical narrow angle, bilateral: Secondary | ICD-10-CM | POA: Diagnosis not present

## 2021-05-20 ENCOUNTER — Other Ambulatory Visit: Payer: Self-pay

## 2021-05-20 ENCOUNTER — Encounter: Payer: Self-pay | Admitting: Nurse Practitioner

## 2021-05-20 ENCOUNTER — Ambulatory Visit (INDEPENDENT_AMBULATORY_CARE_PROVIDER_SITE_OTHER): Payer: Medicare Other | Admitting: Nurse Practitioner

## 2021-05-20 DIAGNOSIS — R251 Tremor, unspecified: Secondary | ICD-10-CM

## 2021-05-20 DIAGNOSIS — G629 Polyneuropathy, unspecified: Secondary | ICD-10-CM | POA: Diagnosis not present

## 2021-05-20 NOTE — Patient Instructions (Addendum)
Tremor Peripheral neuropathy:  Continue current medications  Stay active  Will place referral to neurology  Follow up:  Follow up as needed

## 2021-05-20 NOTE — Progress Notes (Signed)
Virtual Visit via Telephone Note  I connected with [name] on 05/20/21 at 10:00 AM EST by telephone and verified that I am speaking with the correct person using two identifiers.  Location: Patient: home Provider: office   I discussed the limitations, risks, security and privacy concerns of performing an evaluation and management service by telephone and the availability of in person appointments. I also discussed with the patient that there may be a patient responsible charge related to this service. The patient expressed understanding and agreed to proceed.   History of Present Illness:  Patient presents today through telephone visit for tremors.  He states that he does have past family history with his father having Parkinson's.  He states that he has been having tremors for the last few years but the tremors are worsening.  He states that he is having trouble signing his name, and has issues with fine motor control.  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  Patient has never been evaluated by neurology and would like a referral.  We discussed that we will place referral today. Denies f/c/s, n/v/d, hemoptysis, PND, chest pain or edema.     Observations/Objective:  Vitals with BMI 03/18/2021 03/18/2021 03/18/2021  Height - - 5\' 9"   Weight - - 197 lbs  BMI - - [id number]  Systolic [id number]  Diastolic 91 91 76  Pulse - - 85      Assessment and Plan:  Tremor Peripheral neuropathy:  Continue current medications  Stay active  Will place referral to neurology  Follow up:  Follow up as needed    I discussed the assessment and treatment plan with the patient. The patient was provided an opportunity to ask questions and all were answered. The patient agreed with the plan and demonstrated an understanding of the instructions.   The patient was advised to call back or seek an in-person evaluation if the symptoms worsen or if the  condition fails to improve as anticipated.  I provided 23 minutes of non-face-to-face time during this encounter.   Fenton Foy, NP

## 2021-05-27 ENCOUNTER — Encounter: Payer: Self-pay | Admitting: Diagnostic Neuroimaging

## 2021-05-27 ENCOUNTER — Other Ambulatory Visit: Payer: Self-pay

## 2021-05-27 ENCOUNTER — Ambulatory Visit: Payer: Medicare Other | Admitting: Diagnostic Neuroimaging

## 2021-05-27 VITALS — BP 132/83 | HR 93 | Ht 69.0 in | Wt 202.6 lb

## 2021-05-27 DIAGNOSIS — G250 Essential tremor: Secondary | ICD-10-CM

## 2021-05-27 MED ORDER — PRIMIDONE 50 MG PO TABS: 25.0000 mg | ORAL_TABLET | Freq: Two times a day (BID) | ORAL | 6 refills | Status: DC
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
## 2021-05-27 NOTE — Patient Instructions (Signed)
°  ESSENTIAL TREMOR - trial of primidone 25-50mg  twice a day   DIABETIC NEUROPATHY - continue diabetes control - gabapentin for neuropathic pain - consider capsaicin cream, lidocaine patch / cream, alpha-lipoic acid 600mg  daily

## 2021-06-09 ENCOUNTER — Other Ambulatory Visit: Payer: Medicare Other | Admitting: Family

## 2021-06-09 DIAGNOSIS — G4700 Insomnia, unspecified: Secondary | ICD-10-CM
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
## 2021-06-17 ENCOUNTER — Ambulatory Visit: Payer: Medicare Other | Admitting: Family

## 2021-06-18 ENCOUNTER — Other Ambulatory Visit: Payer: Self-pay

## 2021-06-18 ENCOUNTER — Ambulatory Visit (INDEPENDENT_AMBULATORY_CARE_PROVIDER_SITE_OTHER): Payer: Medicare Other | Admitting: Family

## 2021-06-18 ENCOUNTER — Encounter: Payer: Self-pay | Admitting: Family

## 2021-06-18 VITALS — BP 122/75 | HR 83 | Temp 98.3°F | Resp 16 | Ht 69.02 in | Wt 207.0 lb

## 2021-06-18 DIAGNOSIS — I10 Essential (primary) hypertension: Secondary | ICD-10-CM | POA: Diagnosis not present

## 2021-06-18 DIAGNOSIS — E1142 Type 2 diabetes mellitus with diabetic polyneuropathy: Secondary | ICD-10-CM | POA: Diagnosis not present

## 2021-06-18 DIAGNOSIS — R609 Edema, unspecified: Secondary | ICD-10-CM

## 2021-06-18 DIAGNOSIS — E119 Type 2 diabetes mellitus without complications: Secondary | ICD-10-CM

## 2021-06-18 DIAGNOSIS — H9193 Unspecified hearing loss, bilateral: Secondary | ICD-10-CM

## 2021-06-18 LAB — POCT GLYCOSYLATED HEMOGLOBIN (HGB A1C): Hemoglobin A1C: 6.5 % — AB (ref 4.0–5.6)

## 2021-06-18 MED ORDER — GABAPENTIN 100 MG PO CAPS: ORAL_CAPSULE | ORAL | 2 refills | Status: DC

## 2021-06-18 MED ORDER — AMLODIPINE BESYLATE 10 MG PO TABS: 10.0000 mg | ORAL_TABLET | Freq: Every day | ORAL | 0 refills | Status: DC

## 2021-06-18 MED ORDER — METFORMIN HCL 500 MG PO TABS: 500.0000 mg | ORAL_TABLET | Freq: Two times a day (BID) | ORAL | 0 refills | Status: DC

## 2021-06-18 MED ORDER — LOSARTAN POTASSIUM 50 MG PO TABS: 50.0000 mg | ORAL_TABLET | Freq: Every day | ORAL | 0 refills | Status: DC

## 2021-06-18 NOTE — Progress Notes (Signed)
Pt presents for hypertension and diabetes follow-up,  A1c up to 6.5% from 6.3%, pt still having complaints of bilateral foot edema in the evenings  Request referral to ENT

## 2021-06-18 NOTE — Progress Notes (Signed)
Diabetes discussed in office.

## 2021-06-18 NOTE — Patient Instructions (Signed)
Losartan; Hydrochlorothiazide Tablets What is this medication? LOSARTAN; HYDROCHLOROTHIAZIDE (loe SAR tan; hye droe klor oh THYE a zide) treats high blood pressure. It may also be used to prevent a stroke in people with heart disease and high blood pressure. It relaxes your blood vessels and helps your kidneys remove more fluid through the urine, which lowers blood pressure. This medication is a combination of an ARB and diuretic. This medicine may be used for other purposes; ask your health care provider or pharmacist if you have questions. COMMON BRAND NAME(S): Hyzaar What should I tell my care team before I take this medication? They need to know if you have any of these conditions: Decreased urine Diabetes If you are on a special diet, like a low-salt diet Immune system problems, like lupus Kidney disease Liver disease An unusual or allergic reaction to losartan, hydrochlorothiazide, sulfa drugs, other medications, foods, dyes, or preservatives Pregnant or trying to get pregnant Breast-feeding How should I use this medication? Take this medication by mouth. Take it as directed on the prescription label at the same time every day. You can take it with or without food. If it upsets your stomach, take it with food. Keep taking it unless your care team tells you to stop. Talk to your care team about the use of this drug in children. Special care may be needed. Overdosage: If you think you have taken too much of this medicine contact a poison control center or emergency room at once. NOTE: This medicine is only for you. Do not share this medicine with others. What if I miss a dose? If you miss a dose, take it as soon as you can. If it is almost time for your next dose, take only that dose. Do not take double or extra doses. What may interact with this medication? Do not take this medication with any of the following: Cidofovir Dofetilide Tranylcypromine This medication may also interact  with the following: Barbiturates, like phenobarbital Blood pressure medications Celecoxib Diuretics, especially triamterene, spironolactone or amiloride Fluconazole Lithium Medications for diabetes Medications that relax the muscles for surgery Narcotic medications for pain NSAIDs, medications for pain and inflammation, like ibuprofen or naproxen Potassium salts or potassium supplements Rifampin Some cholesterol-lowering medications like cholestyramine or colestipol Steroid medications like prednisone or cortisone This list may not describe all possible interactions. Give your health care provider a list of all the medicines, herbs, non-prescription drugs, or dietary supplements you use. Also tell them if you smoke, drink alcohol, or use illegal drugs. Some items may interact with your medicine. What should I watch for while using this medication? Check your blood pressure regularly while you are taking this medication. Ask your care team what your blood pressure should be and when you should contact them. When you check your blood pressure, write down the measurements to show your care team. If you are taking this medication for a long time, you must visit your care team for regular checks on your progress. Make sure you schedule appointments on a regular basis. You must not get dehydrated. Ask your care team how much fluid you need to drink a day. Check with them if you get an attack of severe diarrhea, nausea and vomiting, or if you sweat a lot. The loss of too much body fluid can make it dangerous for you to take this medication. Women should inform their doctor if they wish to become pregnant or think they might be pregnant. There is a potential for serious side  effects to an unborn child, particularly in the second or third trimester. Talk to your care team or pharmacist for more information. You may get drowsy or dizzy. Do not drive, use machinery, or do anything that needs mental  alertness until you know how this medication affects you. Do not stand or sit up quickly, especially if you are an older patient. This reduces the risk of dizzy or fainting spells. Alcohol can make you more drowsy and dizzy. Avoid alcoholic drinks. This medication may increase blood sugar. Ask your care team if changes in diet or medications are needed if you have diabetes. Talk to your care team about your risk of skin cancer. You may be more at risk for skin cancer if you take this medication. This medication can make you more sensitive to the sun. Keep out of the sun. If you cannot avoid being in the sun, wear protective clothing and use sunscreen. Do not use sun lamps or tanning beds/booths. Avoid salt substitutes unless you are told otherwise by your care team. Do not treat yourself for coughs, colds, or pain while you are taking this medication without asking your care team for advice. Some ingredients may increase your blood pressure. What side effects may I notice from receiving this medication? Side effects that you should report to your care team as soon as possible: Allergic reactions--skin rash, itching, hives, swelling of the face, lips, tongue, or throat Dehydration--increased thirst, dry mouth, feeling faint or lightheaded, headache, dark yellow or brown urine Gout--severe pain, redness, warmth, or swelling in the joints, such as the big toe Kidney injury--decrease in the amount of urine, swelling of the ankles, hands, or feet Low blood pressure--dizziness, feeling faint or lightheaded, blurry vision Low potassium level--muscle pain or cramps, unusual weakness, fatigue, fast or irregular heartbeat, constipation Sudden eye pain or change in vision such as blurred vision, seeing halos around lights, vision loss Side effects that usually do not require medical attention (report to your care team if they continue or are bothersome): Change in sex drive or  performance Dizziness Headache Runny or stuffy nose Upset stomach This list may not describe all possible side effects. Call your doctor for medical advice about side effects. You may report side effects to FDA at 1-800-FDA-1088. Where should I keep my medication? Keep out of the reach of children and pets. Store at room temperature between 15 and 30 degrees C (59 and 86 degrees F). Protect from light. Keep the container tightly closed. Throw away any unused medication after the expiration date. NOTE: This sheet is a summary. It may not cover all possible information. If you have questions about this medicine, talk to your doctor, pharmacist, or health care provider.  2022 Elsevier/Gold Standard (2021-01-20 00:00:00)

## 2021-06-22 ENCOUNTER — Other Ambulatory Visit: Payer: Self-pay

## 2021-06-22 ENCOUNTER — Ambulatory Visit: Payer: Medicare Other | Admitting: Podiatry

## 2021-06-22 VITALS — BP 115/74 | HR 74 | Temp 98.4°F | Resp 16

## 2021-06-22 DIAGNOSIS — I872 Venous insufficiency (chronic) (peripheral): Secondary | ICD-10-CM | POA: Diagnosis not present

## 2021-06-22 DIAGNOSIS — R609 Edema, unspecified: Secondary | ICD-10-CM

## 2021-06-23 NOTE — Progress Notes (Signed)
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  I have swelling and numbness in my feet.  I'm Diabetic."    70 y.o. male presents with the above complaint. History confirmed with patient.  Feels like the swelling got worse after having knee replacements.  Objective:  Physical Exam: warm, good capillary refill, no trophic changes or ulcerative lesions, normal DP and PT pulses, normal sensory exam, and +1 pitting edema bilateral with hemosiderosis and venous insufficiency noted varicose veins in the calves.. Assessment:   1. Peripheral edema   2. Venous (peripheral) insufficiency      Plan:  Patient was evaluated and treated and all questions answered.  Discussed treatment of and management options for peripheral venous insufficiency.  He currently works on his feet quite a lot and I recommended compression stockings for this and elevation when he is home off work.  Placed referral to [facility] for evaluation to see if he is a candidate for any procedural ablations.  We will see him back as needed for this.  Return if symptoms worsen or fail to improve.

## 2021-07-01 ENCOUNTER — Other Ambulatory Visit: Payer: Self-pay | Admitting: Family

## 2021-07-01 DIAGNOSIS — E1142 Type 2 diabetes mellitus with diabetic polyneuropathy: Secondary | ICD-10-CM

## 2021-07-28 ENCOUNTER — Other Ambulatory Visit: Payer: Self-pay

## 2021-07-28 ENCOUNTER — Ambulatory Visit: Payer: Medicare Other | Attending: Family | Admitting: Audiologist

## 2021-07-28 DIAGNOSIS — H9313 Tinnitus, bilateral: Secondary | ICD-10-CM | POA: Diagnosis not present

## 2021-07-28 DIAGNOSIS — H90A32 Mixed conductive and sensorineural hearing loss, unilateral, left ear with restricted hearing on the contralateral side: Secondary | ICD-10-CM | POA: Insufficient documentation

## 2021-07-28 DIAGNOSIS — H90A21 Sensorineural hearing loss, unilateral, right ear, with restricted hearing on the contralateral side: Secondary | ICD-10-CM | POA: Insufficient documentation

## 2021-07-28 NOTE — Procedures (Signed)
?[facility] ?[address] ?[place], [place]  [address] ?[phone] ? ?AUDIOLOGICAL  EVALUATION ? 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 ?History: ?[name] was seen for an audiological evaluation due to a decrease in hearing over the last year. [name] stated that Corey has experienced tinnitus for the last 30 years. Corey stated his tinnitus is mostly non-bothersome and Corey is able to tune it out except for when going to sleep at night. Corey had a tinnitus evaluation about 30 years ago by ENT physician. [name] a gradual decrease in his hearing over the last year. Corey stated Corey has trouble in background noise. Corey stated that Corey feels Corey hears better out of his right ear. [name] denied any pain, pressure, or fullness at today's appointment. [name] has an extensive history of noise exposure due to occupational work in [employer] and recreational firearm use. [name] denied the use of hearing protection when exposed to noise. No other case history was gathered.  ? ? ?Evaluation:  ?Otoscopy showed a clear view of the tympanic membranes, bilaterally ?Tympanometry results were consistent with normal middle ear mobility, bilaterally ?Audiometric testing was completed using Conventional Audiometry techniques with insert earphones and high-frequency headphones. 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 An asymmetry was noted between the right and left  ears with the right ear having better hearing sensitivity at 1000-1500 Hz. Speech Recognition Thresholds were obtained at 35 dB HL in the right ear and at 50 dB HL in the left ear. Word Recognition Testing was completed at 75 dB HL and [name] scored 92% in the right ear and completed at 90 dB HL with masking and scored 76% in the left ear.  ?Tinnitus matching was completed and tinnitus was matched to 8000 Hz at 2 dB SL. [name] reported an 8 for tinnitus matching on a scale of 1-10 with 10 being a perfect match. Positive for residual inhibition (RI) using 20 dB SL narrowband noise at 500 Hz. [name] is a candidate for masking therapy.  ? 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 [name] is a hearing aid candidate due to the degree of hearing loss however, it is recommended that Corey first follow up with a Ear, Nose, and Throat physician to address the asymmetrical hearing  loss and conductive component in the left ear. [name] was counseled on masking therapies and their benefit for tinnitus. [name] was given a copy of his hearing results, a list of hearing aid providers in the area, and a tinnitus hand out to take with him at today's appointment.  ? ? ?Recommendations: ?1.   Follow up with ENT to address asymmetry and conductive component of the left ear ?2.  Following ENT approval amplification can be trailed ? ?  ?If you have any questions please feel free to contact me at [phone]. ? ?Lorenza Evangelist, [place].  ?Audiology Intern ? ?Alfonse Alpers ?Audiologist, Au.D., CCC-A ?07/28/2021  8:05 AM ? ?Cc: Camillia Herter, NP ? ?
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
## 2021-09-14 ENCOUNTER — Ambulatory Visit (INDEPENDENT_AMBULATORY_CARE_PROVIDER_SITE_OTHER): Payer: Medicare Other | Admitting: Family

## 2021-09-14 DIAGNOSIS — E1142 Type 2 diabetes mellitus with diabetic polyneuropathy: Secondary | ICD-10-CM

## 2021-09-14 DIAGNOSIS — I10 Essential (primary) hypertension: Secondary | ICD-10-CM

## 2021-09-14 DIAGNOSIS — G4700 Insomnia, unspecified: Secondary | ICD-10-CM | POA: Diagnosis not present

## 2021-09-14 MED ORDER — METFORMIN HCL 500 MG PO TABS: 500.0000 mg | ORAL_TABLET | Freq: Two times a day (BID) | ORAL | 0 refills | Status: DC

## 2021-09-14 MED ORDER — TRAZODONE HCL 50 MG PO TABS: 25.0000 mg | ORAL_TABLET | Freq: Every evening | ORAL | 0 refills | Status: DC | PRN

## 2021-09-14 MED ORDER — LOSARTAN POTASSIUM 50 MG PO TABS: 50.0000 mg | ORAL_TABLET | Freq: Every day | ORAL | 0 refills | Status: DC

## 2021-09-14 MED ORDER — AMLODIPINE BESYLATE 10 MG PO TABS: 10.0000 mg | ORAL_TABLET | Freq: Every day | ORAL | 0 refills | Status: DC

## 2021-09-14 MED ORDER — GABAPENTIN 100 MG PO CAPS: ORAL_CAPSULE | ORAL | 3 refills | Status: DC

## 2021-09-16 ENCOUNTER — Other Ambulatory Visit (INDEPENDENT_AMBULATORY_CARE_PROVIDER_SITE_OTHER): Payer: Medicare Other

## 2021-09-16 DIAGNOSIS — E1142 Type 2 diabetes mellitus with diabetic polyneuropathy: Secondary | ICD-10-CM | POA: Diagnosis not present

## 2021-09-16 LAB — POCT GLYCOSYLATED HEMOGLOBIN (HGB A1C): Hemoglobin A1C: 6.2 % — AB (ref 4.0–5.6)

## 2021-09-16 NOTE — Progress Notes (Signed)
Patient came in to get his A1c check. ?

## 2021-09-16 NOTE — Progress Notes (Signed)
Diabetes at goal. Continue Metformin as prescribed. Follow-up in 4 months.

## 2021-09-16 NOTE — Addendum Note (Signed)
Addended by: Kieth Brightly on: 09/16/2021 08:45 AM ? ? Modules accepted: Orders ? ?

## 2021-11-16 ENCOUNTER — Telehealth: Payer: Self-pay | Admitting: Diagnostic Neuroimaging

## 2021-11-16 NOTE — Telephone Encounter (Signed)
LVM x3, sent mychart msg informing pt of r/s needed for 7/11- MD out.

## 2021-11-18 ENCOUNTER — Telehealth: Payer: Self-pay | Admitting: Diagnostic Neuroimaging

## 2021-11-18 NOTE — Telephone Encounter (Signed)
Pt is asking for a call to discuss a request for a stronger dose of the primidone (MYSOLINE) 50 MG tablet, in the morning.  Pt feels that the current dose is not as relevant as it once was.

## 2021-11-19 ENCOUNTER — Other Ambulatory Visit: Payer: Self-pay | Admitting: Neurology

## 2021-11-19 MED ORDER — PRIMIDONE 50 MG PO TABS: 50.0000 mg | ORAL_TABLET | Freq: Three times a day (TID) | ORAL | 3 refills | Status: DC

## 2021-11-19 NOTE — Telephone Encounter (Signed)
Please call and inform patient to increase the Primidone to 50 mg three times daily. Please let me know if he needs the new prescription sent to the pharmacy.   Thank you

## 2021-11-19 NOTE — Telephone Encounter (Signed)
Called patient and informed him Dr Teresa Coombs advised he increase primidone to 50 mg three x a day. He stated he just picked up refill #60 tabs yesterday, it will last 20 days at new dose. I advised will let MD know he is going to need new Rx in a few weeks. Patient verbalized understanding, appreciation.

## 2021-11-19 NOTE — Telephone Encounter (Signed)
New Rx sent to the pharmacy

## 2021-11-19 NOTE — Telephone Encounter (Signed)
Called patient who stated he's taking primidone 50 mg,  1 tab twice a day. When he started it he saw immediate results. Now it seems to be wearing off. He wonders if he should take dose in afternoon instead of night or if a third dose can be added. His hand tremors are returning mid day. I made him aware Dr Marjory Lies is out of office, will send to work in MD and call him back Patient verbalized understanding, appreciation.

## 2021-11-24 ENCOUNTER — Telehealth: Payer: Medicare Other | Admitting: Diagnostic Neuroimaging

## 2021-12-07 ENCOUNTER — Ambulatory Visit
Admission: EM | Admit: 2021-12-07 | Discharge: 2021-12-07 | Disposition: A | Discharge status: Home / Self Care | Payer: Medicare Other | Attending: Internal Medicine | Admitting: Internal Medicine

## 2021-12-07 DIAGNOSIS — L0201 Cutaneous abscess of face: Secondary | ICD-10-CM

## 2021-12-07 MED ORDER — DOXYCYCLINE HYCLATE 100 MG PO CAPS: 100.0000 mg | ORAL_CAPSULE | Freq: Two times a day (BID) | ORAL | 0 refills | Status: AC

## 2021-12-07 NOTE — Discharge Instructions (Signed)
You have been prescribed an antibiotic for abscess to your face.  Also recommend continuing warm compresses.  Follow-up with the ER if abscess does not improve in the next 48 hours.
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
## 2021-12-07 NOTE — ED Triage Notes (Signed)
Pt reports to uc with co of right cheek skin abscess for 4 days. Pt reports he had an ingrown hair that he attempted to squeeze but it since got worse

## 2021-12-09 ENCOUNTER — Encounter (HOSPITAL_BASED_OUTPATIENT_CLINIC_OR_DEPARTMENT_OTHER): Payer: Self-pay | Admitting: Emergency Medicine

## 2021-12-09 ENCOUNTER — Emergency Department (HOSPITAL_BASED_OUTPATIENT_CLINIC_OR_DEPARTMENT_OTHER)
Admission: EM | Admit: 2021-12-09 | Discharge: 2021-12-09 | Disposition: A | Discharge status: Home / Self Care | Payer: Medicare Other | Attending: Emergency Medicine | Admitting: Emergency Medicine

## 2021-12-09 ENCOUNTER — Other Ambulatory Visit: Payer: Self-pay

## 2021-12-09 DIAGNOSIS — L723 Sebaceous cyst: Secondary | ICD-10-CM | POA: Insufficient documentation

## 2021-12-09 DIAGNOSIS — L089 Local infection of the skin and subcutaneous tissue, unspecified: Secondary | ICD-10-CM

## 2021-12-09 DIAGNOSIS — L0201 Cutaneous abscess of face: Secondary | ICD-10-CM | POA: Insufficient documentation

## 2021-12-09 MED ORDER — LIDOCAINE-EPINEPHRINE (PF) 2 %-1:200000 IJ SOLN
INTRAMUSCULAR | Status: AC
  Filled 2021-12-09: qty 20

## 2021-12-09 NOTE — ED Triage Notes (Signed)
Abscess to R cheek. Has been taking doxycycline.

## 2021-12-09 NOTE — ED Provider Notes (Signed)
[facility] EMERGENCY DEPT Provider Note   CSN: [id number] Arrival date & time: 12/09/21  1532     History  Chief Complaint  Patient presents with   Abscess    [name] is a 71 y.o. male.  Patient is a 71 year old male presenting today with an ongoing abscess to his right cheek in his beard line.  He reports it started as what he thought was an ingrown hair and has worsened over the last 1 week.  He did go to urgent care and they placed him on doxycycline but he reports the symptoms are not improving.  He has no systemic symptoms at this time.  The history is provided by the patient.  Abscess      Home Medications Prior to Admission medications   Medication Sig Start Date End Date Taking? Authorizing Provider  amLODipine (NORVASC) 10 MG tablet Take 1 tablet (10 mg total) by mouth daily. 09/14/21 01/12/22  Rema Fendt, NP  atorvastatin (LIPITOR) 40 MG tablet TAKE 1 TABLET (40 MG TOTAL) BY MOUTH DAILY. 04/13/21   Georganna Skeans, MD  doxycycline (VIBRAMYCIN) 100 MG capsule Take 1 capsule (100 mg total) by mouth 2 (two) times daily. 12/07/21   Gustavus Bryant, FNP  furosemide (LASIX) 20 MG tablet Take 1 tablet (20 mg total) by mouth daily for 14 days. 12/16/20 05/27/21  Rema Fendt, NP  gabapentin (NEURONTIN) 100 MG capsule TAKE 2 CAPSULES BY MOUTH 2 TIMES DAILY. 09/14/21   Rema Fendt, NP  losartan (COZAAR) 50 MG tablet Take 1 tablet (50 mg total) by mouth daily. 09/14/21 01/12/22  Rema Fendt, NP  metFORMIN (GLUCOPHAGE) 500 MG tablet Take 1 tablet (500 mg total) by mouth 2 (two) times daily with a meal. 09/14/21 01/12/22  Rema Fendt, NP  OneTouch Delica Lancets 33G MISC Use to check blood sugars twice a day. Dx: E11.9, Z79.4 07/14/20   Arvilla Market, MD  [facility] VERIO test strip Use to check blood sugars twice a day. Dx: E11.9, Z79.4 07/14/20   Arvilla Market, MD  primidone (MYSOLINE) 50 MG tablet Take 1 tablet (50 mg total) by mouth 3  (three) times daily. 11/19/21 03/19/22  Windell Norfolk, MD  traZODone (DESYREL) 50 MG tablet Take 0.5-1 tablets (25-50 mg total) by mouth at bedtime as needed. for sleep 09/14/21   Rema Fendt, NP      Allergies    Sulfa antibiotics    Review of Systems   Review of Systems  Physical Exam Updated Vital Signs BP (!) 144/76 (BP Location: Right Arm)   Pulse 88   Temp 98.3 F (36.8 C)   Resp 16   Ht 5\' 10"  (1.778 m)   Wt 93 kg   SpO2 97%   BMI 29.41 kg/m  Physical Exam Vitals and nursing note reviewed.  Constitutional:      Appearance: Normal appearance.  HENT:     Head:   Cardiovascular:     Rate and Rhythm: Normal rate.  Pulmonary:     Effort: Pulmonary effort is normal.  Skin:    General: Skin is warm and dry.  Neurological:     Mental Status: He is alert. Mental status is at baseline.  Psychiatric:        Mood and Affect: Mood normal.     ED Results / Procedures / Treatments   Labs (all labs ordered are listed, but only abnormal results are displayed) Labs Reviewed - No data to display  EKG None  Radiology No results found.  Procedures Procedures   INCISION AND DRAINAGE Performed by: Gwyneth Sprout Consent: Verbal consent obtained. Risks and benefits: risks, benefits and alternatives were discussed Type: abscess  Body area: right cheek  Anesthesia: local infiltration  Incision was made with a scalpel.  Local anesthetic: lidocaine 2% with epinephrine  Anesthetic total: 4 ml  Complexity: complex Blunt dissection to break up loculations  Drainage: purulent  Drainage amount: 51mL with sebaceous material  Packing material: none  Patient tolerance: Patient tolerated the procedure well with no immediate complications.   Medications Ordered in ED Medications  lidocaine-EPINEPHrine (XYLOCAINE W/EPI) 2 %-1:200000 (PF) injection (has no administration in time range)    ED Course/ Medical Decision Making/ A&P                            Medical Decision Making  Patient presenting with persistent abscess that is not improving with doxycycline.  I&D as above.  Sebaceous material and pus was removed.  Patient will complete his doxycycline and given local wound care instructions.        Final Clinical Impression(s) / ED Diagnoses Final diagnoses:  Infected sebaceous cyst of skin    Rx / DC Orders ED Discharge Orders     None         Gwyneth Sprout, MD 12/09/21 1901

## 2021-12-09 NOTE — Discharge Instructions (Signed)
You can wash your face with soap and water.  Warm compresses to the area.  It will heal on its own.  The firm and hardness around it will probably take 1 to 2 weeks to go away.

## 2021-12-15 ENCOUNTER — Other Ambulatory Visit: Payer: Self-pay | Admitting: Family

## 2021-12-15 DIAGNOSIS — G4700 Insomnia, unspecified: Secondary | ICD-10-CM

## 2021-12-15 NOTE — Telephone Encounter (Signed)
Order complete. 

## 2021-12-30 ENCOUNTER — Telehealth: Payer: Medicare Other | Admitting: Diagnostic Neuroimaging

## 2021-12-30 ENCOUNTER — Encounter: Payer: Self-pay | Admitting: Diagnostic Neuroimaging

## 2021-12-30 DIAGNOSIS — G250 Essential tremor: Secondary | ICD-10-CM

## 2021-12-30 MED ORDER — PRIMIDONE 50 MG PO TABS: 100.0000 mg | ORAL_TABLET | Freq: Two times a day (BID) | ORAL | 5 refills | Status: AC

## 2021-12-30 NOTE — Progress Notes (Signed)
[facility]  PATIENT: [name] DOB: [birth year]  REFERRING CLINICIAN: Rema Fendt, NP HISTORY FROM: patient  REASON FOR VISIT: follow up    HISTORICAL  CHIEF COMPLAINT:  Chief Complaint  Patient presents with   Tremors    HISTORY OF PRESENT ILLNESS:    UPDATE (12/30/21, VRP): Since last visit, doing well on primidone. Symptoms are improved. Now on primidone 50mg  three times a day. Would like to increase dose, as benefit slightly wearing off.    PRIOR HPI (05/27/21): 71 year old right-handed male here for evaluation of tremor.  Symptoms started in his head around 10 years ago and then in his hands around 5 years ago.  Symptoms have gradually progressed over time.  He notes mainly postural and action tremor.  Mild change in voice.  Family history of similar tremor on his mother side maternal grandmother and several other relatives.  Patient's father had Parkinson's disease.  Symptoms aggravated by lack of sleep and stress.  No change with caffeine or alcohol.  Also with new diagnosis of diabetes about 2 years ago with hemoglobin A1c greater than 13.  A1c now in the 6 range.  Patient did have some numbness and tingling, pain in his feet consistent with neuropathy.  Patient is taking gabapentin.   REVIEW OF SYSTEMS: Full 14 system review of systems performed and negative with exception of: as per HPI.  ALLERGIES: Allergies  Allergen Reactions   Sulfa Antibiotics Hives    HOME MEDICATIONS: Outpatient Medications Prior to Visit  Medication Sig Dispense Refill   primidone (MYSOLINE) 50 MG tablet Take 1 tablet (50 mg total) by mouth 3 (three) times daily. 90 tablet 3   amLODipine (NORVASC) 10 MG tablet Take 1 tablet (10 mg total) by mouth daily. 120 tablet 0   atorvastatin (LIPITOR) 40 MG tablet TAKE 1 TABLET (40 MG TOTAL) BY MOUTH DAILY. 90 tablet 3   doxycycline (VIBRAMYCIN) 100 MG capsule Take 1 capsule (100 mg total) by mouth 2 (two) times daily. 20  capsule 0   furosemide (LASIX) 20 MG tablet Take 1 tablet (20 mg total) by mouth daily for 14 days. 14 tablet 0   gabapentin (NEURONTIN) 100 MG capsule TAKE 2 CAPSULES BY MOUTH 2 TIMES DAILY. 120 capsule 3   losartan (COZAAR) 50 MG tablet Take 1 tablet (50 mg total) by mouth daily. 120 tablet 0   metFORMIN (GLUCOPHAGE) 500 MG tablet Take 1 tablet (500 mg total) by mouth 2 (two) times daily with a meal. 240 tablet 0   OneTouch Delica Lancets 33G MISC Use to check blood sugars twice a day. Dx: E11.9, Z79.4 100 each 2   ONETOUCH VERIO test strip Use to check blood sugars twice a day. Dx: E11.9, Z79.4 100 each 2   traZODone (DESYREL) 50 MG tablet TAKE 1/2 TO 1 TABLETS BY MOUTH AT BEDTIME AS NEEDED FOR SLEEP 30 tablet 2   No facility-administered medications prior to visit.      PHYSICAL EXAM  - telephone visit     DIAGNOSTIC DATA (LABS, IMAGING, TESTING) - I reviewed patient records, labs, notes, testing and imaging myself where available.  Lab Results  Component Value Date   WBC 7.8 06/08/2019   HGB 16.6 06/08/2019   HCT 46.9 06/08/2019   MCV 88.3 06/08/2019   PLT 250 06/08/2019      Component Value Date/Time   NA 138 03/18/2021 0915   K 5.1 03/18/2021 0915   CL 101 03/18/2021 0915   CO2 19 (L)  03/18/2021 0915   GLUCOSE 97 03/18/2021 0915   GLUCOSE 238 (H) 06/09/2019 0726   BUN 25 03/18/2021 0915   CREATININE 1.17 03/18/2021 0915   CALCIUM 10.0 03/18/2021 0915   PROT 8.0 08/04/2020 1115   ALBUMIN 5.0 (H) 08/04/2020 1115   AST 21 08/04/2020 1115   ALT 27 08/04/2020 1115   ALKPHOS 100 08/04/2020 1115   BILITOT 0.5 08/04/2020 1115   GFRNONAA 64 07/30/2019 0835   GFRAA 74 07/30/2019 0835   Lab Results  Component Value Date   CHOL 149 08/04/2020   HDL 42 08/04/2020   LDLCALC 68 08/04/2020   TRIG 237 (H) 08/04/2020   CHOLHDL 3.5 08/04/2020   Lab Results  Component Value Date   HGBA1C 6.2 (A) 09/16/2021   Hemoglobin A1C  Date Value Ref Range Status  09/16/2021  6.2 (A) 4.0 - 5.6 % Final  06/18/2021 6.5 (A) 4.0 - 5.6 % Final  12/16/2020 6.2 (A) 4.0 - 5.6 % Final  08/04/2020 5.2 4.0 - 5.6 % Final  04/08/2020 5.9 (A) 4.0 - 5.6 % Final   HbA1c, POC (prediabetic range)  Date Value Ref Range Status  03/18/2021 6.3 5.7 - 6.4 % Final   Hgb A1c MFr Bld  Date Value Ref Range Status  06/08/2019 13.4 (H) 4.8 - 5.6 % Final    Comment:    (NOTE) Pre diabetes:          5.7%-6.4% Diabetes:              >6.4% Glycemic control for   <7.0% adults with diabetes      No results found for: "VITAMINB12" Lab Results  Component Value Date   TSH 0.890 06/08/2019      ASSESSMENT AND PLAN  71 y.o. year old male here with mild postural and action tremor in head, voice and hands since approximately 2012, most consistent with essential tremor.   Dx:  1. Essential tremor      PLAN:  ESSENTIAL TREMOR (family history on mother's side) - increase primidone to 100mg  twice a day   DIABETIC NEUROPATHY - continue diabetes control - gabapentin for neuropathic pain - consider capsaicin cream, lidocaine patch / cream, alpha-lipoic acid 600mg  daily  Meds ordered this encounter  Medications   primidone (MYSOLINE) 50 MG tablet    Sig: Take 2 tablets (100 mg total) by mouth 2 (two) times daily.    Dispense:  120 tablet    Refill:  5   Return in about 1 year (around 12/31/2022) for MyChart visit (15 min).   Virtual Visit via Telephone Note  I connected with on 12/30/21 at  9:30 AM EDT by a telephone and verified that I am speaking with the correct person using two identifiers.   I discussed the limitations of evaluation and management by telemedicine and the availability of in person appointments. The patient expressed understanding and agreed to proceed.  Patient is at home and I am at the office.   I spent 15 minutes of face-to-face and non-face-to-face time with patient.  This included previsit chart review, lab review, study review,  order entry, electronic health record documentation, patient education.      Lendell Caprice, MD 12/30/2021, 10:03 AM Certified in Neurology, Neurophysiology and Neuroimaging  [facility] [address] [place], [address] [place] [phone]

## 2022-01-04 ENCOUNTER — Other Ambulatory Visit: Payer: Self-pay | Admitting: Family

## 2022-01-04 DIAGNOSIS — Z9189 Other specified personal risk factors, not elsewhere classified: Secondary | ICD-10-CM

## 2022-01-04 DIAGNOSIS — E1142 Type 2 diabetes mellitus with diabetic polyneuropathy: Secondary | ICD-10-CM
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
## 2022-01-04 NOTE — Telephone Encounter (Signed)
Medication Refill - Medication: Gabapentin 100 mg and Atorvastatin 40 mg  Has the patient contacted their pharmacy? No.  New pharmacy (Agent: If no, request that the patient contact the pharmacy for the refill. If patient does not wish to contact the pharmacy document the reason why and proceed with request.) (Agent: If yes, when and what did the pharmacy advise?)  Preferred Pharmacy (with phone number or street name): [facility]  [address] [place] [address] # [phone]  Has the patient been seen for an appointment in the last year OR does the patient have an upcoming appointment? Yes.    Agent: Please be advised that RX refills may take up to 3 business days. We ask that you follow-up with your pharmacy.

## 2022-01-05 MED ORDER — GABAPENTIN 100 MG PO CAPS: ORAL_CAPSULE | ORAL | 3 refills | Status: DC

## 2022-01-05 MED ORDER — ATORVASTATIN CALCIUM 40 MG PO TABS: 40.0000 mg | ORAL_TABLET | Freq: Every day | ORAL | 0 refills | Status: DC

## 2022-01-05 NOTE — Telephone Encounter (Signed)
New pharmacy for patient-remianing Rx forwarded/RF per protocol Requested Prescriptions  Pending Prescriptions Disp Refills  . gabapentin (NEURONTIN) 100 MG capsule 120 capsule 3    Sig: TAKE 2 CAPSULES BY MOUTH 2 TIMES DAILY.     Neurology: Anticonvulsants - gabapentin Passed - 01/04/2022 11:01 AM      Passed - Cr in normal range and within 360 days    Creatinine, Ser  Date Value Ref Range Status  03/18/2021 1.17 0.76 - 1.27 mg/dL Final         Passed - Completed PHQ-2 or PHQ-9 in the last 360 days      Passed - Valid encounter within last 12 months    Recent Outpatient Visits          3 months ago Essential (primary) hypertension   Primary Care at [facility], Amy J, NP   6 months ago Essential hypertension   Primary Care at [facility], Amy J, NP   7 months ago Tremor   Primary Care at [facility], Gildardo Pounds, NP   9 months ago Essential hypertension   Primary Care at [facility], Salomon Fick, NP   10 months ago COVID-19   Primary Care at [facility], Gildardo Pounds, NP      Future Appointments            In 1 week Rema Fendt, NP Primary Care at [facility]           . atorvastatin (LIPITOR) 40 MG tablet 90 tablet 0    Sig: Take 1 tablet (40 mg total) by mouth daily.     Cardiovascular:  Antilipid - Statins Failed - 01/04/2022 11:01 AM      Failed - Lipid Panel in normal range within the last 12 months    Cholesterol, Total  Date Value Ref Range Status  08/04/2020 149 100 - 199 mg/dL Final   LDL Chol Calc (NIH)  Date Value Ref Range Status  08/04/2020 68 0 - 99 mg/dL Final   HDL  Date Value Ref Range Status  08/04/2020 42 >39 mg/dL Final   Triglycerides  Date Value Ref Range Status  08/04/2020 237 (H) 0 - 149 mg/dL Final         Passed - Patient is not pregnant      Passed - Valid encounter within last 12 months    Recent Outpatient Visits          3 months ago Essential (primary) hypertension    Primary Care at [facility], Amy J, NP   6 months ago Essential hypertension   Primary Care at [facility], Amy J, NP   7 months ago Tremor   Primary Care at [facility], Gildardo Pounds, NP   9 months ago Essential hypertension   Primary Care at [facility], Salomon Fick, NP   10 months ago COVID-19   Primary Care at [facility], Gildardo Pounds, NP      Future Appointments            In 1 week Rema Fendt, NP Primary Care at [facility]

## 2022-01-15 ENCOUNTER — Telehealth (INDEPENDENT_AMBULATORY_CARE_PROVIDER_SITE_OTHER): Payer: Medicare Other | Admitting: Family

## 2022-01-15 DIAGNOSIS — G4700 Insomnia, unspecified: Secondary | ICD-10-CM

## 2022-01-15 DIAGNOSIS — E1142 Type 2 diabetes mellitus with diabetic polyneuropathy: Secondary | ICD-10-CM

## 2022-01-15 DIAGNOSIS — Z7984 Long term (current) use of oral hypoglycemic drugs: Secondary | ICD-10-CM | POA: Diagnosis not present

## 2022-01-15 DIAGNOSIS — Z9189 Other specified personal risk factors, not elsewhere classified: Secondary | ICD-10-CM

## 2022-01-15 DIAGNOSIS — I10 Essential (primary) hypertension: Secondary | ICD-10-CM

## 2022-01-15 MED ORDER — AMLODIPINE BESYLATE 10 MG PO TABS: 10.0000 mg | ORAL_TABLET | Freq: Every day | ORAL | 2 refills | Status: AC

## 2022-01-15 MED ORDER — LOSARTAN POTASSIUM 50 MG PO TABS: 50.0000 mg | ORAL_TABLET | Freq: Every day | ORAL | 2 refills | Status: AC

## 2022-01-15 MED ORDER — METFORMIN HCL 500 MG PO TABS: 500.0000 mg | ORAL_TABLET | Freq: Two times a day (BID) | ORAL | 2 refills | Status: AC

## 2022-03-29 ENCOUNTER — Other Ambulatory Visit: Payer: Self-pay | Admitting: Family

## 2022-03-29 DIAGNOSIS — Z9189 Other specified personal risk factors, not elsewhere classified: Secondary | ICD-10-CM

## 2022-04-29 ENCOUNTER — Other Ambulatory Visit: Payer: Self-pay | Admitting: Family

## 2022-04-29 DIAGNOSIS — E1142 Type 2 diabetes mellitus with diabetic polyneuropathy: Secondary | ICD-10-CM

## 2022-05-11 ENCOUNTER — Telehealth: Payer: Self-pay | Admitting: Family

## 2022-05-11 ENCOUNTER — Other Ambulatory Visit: Payer: Self-pay | Admitting: Family

## 2022-05-11 DIAGNOSIS — I10 Essential (primary) hypertension: Secondary | ICD-10-CM

## 2022-05-11 DIAGNOSIS — E1142 Type 2 diabetes mellitus with diabetic polyneuropathy: Secondary | ICD-10-CM

## 2022-05-12 NOTE — Telephone Encounter (Signed)
Pt resides in [facility], per provider can no longer prescribe any medications, unless he schedules a visit in [place]
